# Patient Record
Sex: Male | Born: 1980 | Race: White | Hispanic: No | Marital: Married | Smoking: Never smoker
Health system: Southern US, Community
[De-identification: ages and names within clinical notes are randomized; demographics above are authoritative.]

## PROBLEM LIST (undated history)

## (undated) DIAGNOSIS — Z973 Presence of spectacles and contact lenses: Secondary | ICD-10-CM

## (undated) DIAGNOSIS — N201 Calculus of ureter: Secondary | ICD-10-CM

## (undated) DIAGNOSIS — Z87442 Personal history of urinary calculi: Secondary | ICD-10-CM

## (undated) DIAGNOSIS — I10 Essential (primary) hypertension: Secondary | ICD-10-CM

## (undated) DIAGNOSIS — K219 Gastro-esophageal reflux disease without esophagitis: Secondary | ICD-10-CM

## (undated) DIAGNOSIS — L719 Rosacea, unspecified: Secondary | ICD-10-CM

## (undated) DIAGNOSIS — N200 Calculus of kidney: Secondary | ICD-10-CM

## (undated) HISTORY — DX: Essential (primary) hypertension: I10

## (undated) HISTORY — DX: Rosacea, unspecified: L71.9

---

## 2008-09-06 ENCOUNTER — Encounter (INDEPENDENT_AMBULATORY_CARE_PROVIDER_SITE_OTHER): Payer: Self-pay | Admitting: *Deleted

## 2008-10-05 ENCOUNTER — Encounter: Payer: Self-pay | Admitting: Internal Medicine

## 2008-10-12 ENCOUNTER — Ambulatory Visit: Payer: Self-pay | Admitting: Internal Medicine

## 2008-10-12 DIAGNOSIS — L719 Rosacea, unspecified: Secondary | ICD-10-CM | POA: Insufficient documentation

## 2008-11-02 ENCOUNTER — Telehealth (INDEPENDENT_AMBULATORY_CARE_PROVIDER_SITE_OTHER): Payer: Self-pay | Admitting: *Deleted

## 2009-11-08 ENCOUNTER — Ambulatory Visit: Payer: Self-pay | Admitting: Internal Medicine

## 2009-11-08 DIAGNOSIS — E782 Mixed hyperlipidemia: Secondary | ICD-10-CM | POA: Insufficient documentation

## 2009-11-14 LAB — CONVERTED CEMR LAB
ALT: 18 units/L (ref 0–53)
AST: 17 units/L (ref 0–37)
BUN: 15 mg/dL (ref 6–23)
Basophils Absolute: 0 10*3/uL (ref 0.0–0.1)
Basophils Relative: 0 % (ref 0–1)
Bilirubin, Direct: 0.1 mg/dL (ref 0.0–0.3)
CO2: 25 meq/L (ref 19–32)
Calcium: 9.4 mg/dL (ref 8.4–10.5)
Cholesterol: 199 mg/dL (ref 0–200)
Eosinophils Absolute: 0.2 10*3/uL (ref 0.0–0.7)
Eosinophils Relative: 3 % (ref 0–5)
Glucose, Bld: 86 mg/dL (ref 70–99)
HCT: 47.3 % (ref 39.0–52.0)
Hemoglobin: 16.2 g/dL (ref 13.0–17.0)
Indirect Bilirubin: 0.5 mg/dL (ref 0.0–0.9)
MCHC: 34.2 g/dL (ref 30.0–36.0)
MCV: 86.8 fL (ref 78.0–100.0)
Monocytes Absolute: 0.5 10*3/uL (ref 0.1–1.0)
Monocytes Relative: 7 % (ref 3–12)
Neutro Abs: 4.5 10*3/uL (ref 1.7–7.7)
RBC: 5.45 M/uL (ref 4.22–5.81)
RDW: 13.3 % (ref 11.5–15.5)
Sodium: 141 meq/L (ref 135–145)
TSH: 1.094 microintl units/mL (ref 0.350–4.500)
Total Bilirubin: 0.6 mg/dL (ref 0.3–1.2)
Total CHOL/HDL Ratio: 5.2

## 2010-09-09 NOTE — Assessment & Plan Note (Signed)
Summary: CPX,PLANS TO FAST/RH......   Vital Signs:  Patient profile:   30 year old male Height:      70.25 inches Weight:      182.13 pounds BMI:     26.04 Pulse rate:   58 / minute BP sitting:   130 / 80  Vitals Entered By: Kandice Hams (November 08, 2009 3:01 PM) CC: cpx   History of Present Illness: CPX c/o eye lesions   Allergies: No Known Drug Allergies  Past History:  Past Medical History: Reviewed history from 10/12/2008 and no changes required. rosacea (symptoms increase w/ beer which he avoids , dx @ dermatology)  Past Surgical History: Reviewed history from 10/12/2008 and no changes required. Denies surgical history  Family History: Reviewed history from 10/12/2008 and no changes required. 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 CV:  Denies chest pain or discomfort and swelling of feet. Resp:  Denies cough and shortness of breath. GI:  Denies bloody stools, nausea, and vomiting. GU:  Denies dysuria and hematuria.  Physical Exam  General:  alert, well-developed, and well-nourished.   Eyes:  he has a few lesions in the eyelids consistent with xanthomas Neck:   no masses and no thyroid nodules or tenderness.   Lungs:  normal respiratory effort, no intercostal retractions, no accessory muscle use, and normal breath sounds.   Heart:  normal rate, regular rhythm, and no murmur.   Abdomen:  soft, non-tender, no distention, no masses, no guarding, and no rigidity.   Extremities:  no edema Psych:  Cognition and judgment appear intact. Alert and cooperative with normal attention span and concentration.    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

## 2011-01-06 ENCOUNTER — Ambulatory Visit (INDEPENDENT_AMBULATORY_CARE_PROVIDER_SITE_OTHER): Payer: BC Managed Care – PPO | Admitting: Internal Medicine

## 2011-01-06 ENCOUNTER — Encounter: Payer: Self-pay | Admitting: Internal Medicine

## 2011-01-06 DIAGNOSIS — L089 Local infection of the skin and subcutaneous tissue, unspecified: Secondary | ICD-10-CM | POA: Insufficient documentation

## 2011-01-06 MED ORDER — MUPIROCIN CALCIUM 2 % EX CREA: TOPICAL_CREAM | CUTANEOUS | Status: AC

## 2011-01-06 MED ORDER — DOXYCYCLINE HYCLATE 100 MG PO TABS: 100.0000 mg | ORAL_TABLET | Freq: Two times a day (BID) | ORAL | Status: AC

## 2011-01-06 NOTE — Assessment & Plan Note (Signed)
Likely has a staph infection, not clear to me if he really had a infected "whitehead". Recommend antibiotics and Bactroban, if no better, may need to be seen by dermatology.

## 2011-01-06 NOTE — Progress Notes (Signed)
  Subjective:    Patient ID: Willie Dyer, male    DOB: 04-05-1981, 30 y.o.   MRN: [id number]  HPI 10 days ago noted a  "pimple" at the chain, it never developed a headache, never saw any discharge, is not  going away.  Past Medical History  Diagnosis Date  . Rosacea     symptoms increase w/ beer which he avoicds, dx @ derm   No past surgical history on file.   Review of Systems     Objective:   Physical Exam  HENT:  Head:     Alert, oriented, no apparent distress.       Assessment & Plan:

## 2011-01-06 NOTE — Patient Instructions (Signed)
Call if no better in few days  

## 2011-06-16 ENCOUNTER — Encounter: Payer: Self-pay | Admitting: Internal Medicine

## 2011-06-18 ENCOUNTER — Encounter: Payer: Self-pay | Admitting: Internal Medicine

## 2011-06-18 ENCOUNTER — Ambulatory Visit (INDEPENDENT_AMBULATORY_CARE_PROVIDER_SITE_OTHER): Payer: BC Managed Care – PPO | Admitting: Internal Medicine

## 2011-06-18 DIAGNOSIS — Z0000 Encounter for general adult medical examination without abnormal findings: Secondary | ICD-10-CM | POA: Insufficient documentation

## 2011-06-18 DIAGNOSIS — Z23 Encounter for immunization: Secondary | ICD-10-CM

## 2011-06-18 LAB — COMPREHENSIVE METABOLIC PANEL
ALT: 33 U/L (ref 0–53)
CO2: 29 mEq/L (ref 19–32)
Calcium: 8.9 mg/dL (ref 8.4–10.5)
Chloride: 105 mEq/L (ref 96–112)
Creatinine, Ser: 0.9 mg/dL (ref 0.4–1.5)
GFR: 101.16 mL/min (ref >60.00)
Glucose, Bld: 87 mg/dL (ref 70–99)
Sodium: 142 mEq/L (ref 135–145)
Total Protein: 7.9 g/dL (ref 6.0–8.3)

## 2011-06-18 LAB — CBC WITH DIFFERENTIAL/PLATELET
Basophils Absolute: 0 10*3/uL (ref 0.0–0.1)
Eosinophils Relative: 8.7 % — ABNORMAL HIGH (ref 0.0–5.0)
Lymphocytes Relative: 30.1 % (ref 12.0–46.0)
Monocytes Relative: 8 % (ref 3.0–12.0)
Neutrophils Relative %: 52.6 % (ref 43.0–77.0)
Platelets: 186 10*3/uL (ref 150.0–400.0)
RDW: 13.7 % (ref 11.5–14.6)
WBC: 5.9 10*3/uL (ref 4.5–10.5)

## 2011-06-18 LAB — LIPID PANEL
Cholesterol: 172 mg/dL (ref 0–200)
HDL: 36.1 mg/dL — ABNORMAL LOW (ref >39.00)
Triglycerides: 125 mg/dL (ref 0.0–149.0)
VLDL: 25 mg/dL (ref 0.0–40.0)

## 2011-06-18 NOTE — Assessment & Plan Note (Signed)
Tdap 2010 Had a flu shot already Counseling about diet, exercise, self testicular exam. Labs

## 2011-06-18 NOTE — Progress Notes (Signed)
  Subjective:    Patient ID: Willie Dyer, male    DOB: 06-14-1981, 30 y.o.   MRN: [id number]  HPI Complete physical exam   Past Medical History  Diagnosis Date  . Rosacea     symptoms increase w/ beer which he avoicds, dx @ derm   Past Surgical History  Procedure Date  . No past surgeries    History   Social History  . Marital Status: Married    Spouse Name: N/A    Number of Children: 0  . 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 Smoking status: Never Smoker   . Smokeless tobacco: Never Used  . Alcohol Use: Yes     socially   . Drug Use: No  . Sexually Active: Not on file   Other Topics Concern  . Not on file   Social History Narrative   Diet: room for improvement ---Exercise:2 or 3 times a week---Wife pregnant    Family History  Problem Relation Age of Onset  . Coronary artery disease      GF late in life  . Stroke      GF last in life  . Hypertension Father   . Diabetes Neg Hx   . Colon cancer Neg Hx   . Prostate cancer Neg Hx   . Cancer Neg Hx     Review of Systems Reports he is feeling great, just recovering from a cold. No specific concerns Reports there is room for improvement on his diet. He tries to exercise 2 or 3 times a week, get around 3 hours weekly. Acne is well controlled. Was seen last a few months ago with infection, it cleared quickly. Doing well emotionally.     Objective:   Physical Exam  Constitutional: He is oriented to person, place, and time. He appears well-developed and well-nourished. No distress.  HENT:  Head: Normocephalic and atraumatic.  Neck: No thyromegaly present.       Normal carotid pulse  Cardiovascular: Normal rate, regular rhythm and normal heart sounds.   No murmur heard. Pulmonary/Chest: Effort normal and breath sounds normal. No respiratory distress. He has no wheezes. He has no rales.  Abdominal: Soft. Bowel sounds are normal. He exhibits no  distension. There is no tenderness. There is no rebound and no guarding.  Musculoskeletal: He exhibits no edema.  Neurological: He is alert and oriented to person, place, and time.  Skin: Skin is warm and dry. He is not diaphoretic.  Psychiatric: He has a normal mood and affect. His behavior is normal. Judgment and thought content normal.       Assessment & Plan:

## 2011-06-18 NOTE — Patient Instructions (Signed)
Call as needed, follow up 1 year

## 2012-06-21 ENCOUNTER — Ambulatory Visit (INDEPENDENT_AMBULATORY_CARE_PROVIDER_SITE_OTHER): Payer: BC Managed Care – PPO | Admitting: Internal Medicine

## 2012-06-21 ENCOUNTER — Encounter: Payer: Self-pay | Admitting: Internal Medicine

## 2012-06-21 VITALS — BP 138/82 | HR 70 | Temp 98.0°F | Ht 71.25 in | Wt 180.0 lb

## 2012-06-21 DIAGNOSIS — Z0000 Encounter for general adult medical examination without abnormal findings: Secondary | ICD-10-CM

## 2012-06-21 DIAGNOSIS — M542 Cervicalgia: Secondary | ICD-10-CM

## 2012-06-21 LAB — LIPID PANEL
Cholesterol: 198 mg/dL (ref 0–200)
HDL: 39 mg/dL — ABNORMAL LOW (ref >39.00)
Total CHOL/HDL Ratio: 5
Triglycerides: 108 mg/dL (ref 0.0–149.0)

## 2012-06-21 MED ORDER — CYCLOBENZAPRINE HCL 10 MG PO TABS: 10.0000 mg | ORAL_TABLET | Freq: Every evening | ORAL | Status: DC | PRN

## 2012-06-21 NOTE — Patient Instructions (Signed)
Heat pads as neede Ibuprofen (Motrin) 200 mg OTC 2 or 3 tabs every 8 hours as need for pain . This medicine can cause gastritis-ulcers in the stomach, if you develop nausea, stomach pain, change in the color of stools : stop ibuprofen and call us Flexeril (a muscle relaxant) at night, will make you sleepy If you are not well in 2-3 weeks or if the problem become a recurrent issue, let me know

## 2012-06-21 NOTE — Assessment & Plan Note (Signed)
See history of present illness, on-off neck pain, the last episode had some radicular features. Plan: Conservative treatment, see instructions.

## 2012-06-21 NOTE — Assessment & Plan Note (Signed)
Tdap 2010 Had a flu shot already doing great w/ exercise, discussed diet  self testicular exam encouraged Labs last year wnl, check a FLP

## 2012-06-21 NOTE — Progress Notes (Signed)
  Subjective:    Patient ID: Willie Dyer, male    DOB: Sep 18, 1980, 31 y.o.   MRN: [id number]  HPI CPX  Past Medical History  Diagnosis Date  . Rosacea     symptoms increase w/ beer which he avoicds, dx @ derm   Past Surgical History  Procedure Date  . No past surgeries    History   Social History  . Marital Status: Married    Spouse Name: N/A    Number of Children: 1  . 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 Smoking status: Never Smoker   . Smokeless tobacco: Never Used  . Alcohol Use: Yes     Comment: socially   . Drug Use: No  . Sexually Active: Yes   Other Topics Concern  . Not on file   Social History Narrative   Diet: room for improvement ---Exercise : Goes to the gym 3 or 4 times a week, runs 4 miles.----Has a 17-month-old baby at home   Family History  Problem Relation Age of Onset  . Coronary artery disease      GF late in life  . Stroke      GF last in life  . Hypertension Father   . Diabetes Neg Hx   . Colon cancer Neg Hx   . Prostate cancer Neg Hx   . Cancer Neg Hx     Review of Systems Had 4 episodes of posterior neck pain in the summer, episodes lasted 4-5 days, neck felt stiff and had decreased range of motion. 3 weeks ago developed a different neck pain, the pain was left-sided and with some radiation to the left trapezoid area. Overall the pain is already getting better. No chest pain or shortness of breath No nausea, vomiting, diarrhea or blood in the stools. No dysuria or gross hematuria.     Objective:   Physical Exam General -- alert, well-developed, and well-nourished.   Neck - full range of motion, nontender to palpation of the cervical spine Lungs -- normal respiratory effort, no intercostal retractions, no accessory muscle use, and normal breath sounds.   Heart-- normal rate, regular rhythm, no murmur, and no gallop.   Abdomen--soft, non-tender, no distention, no  masses, no HSM, no guarding, and no rigidity.   Extremities-- no pretibial edema bilaterally Neurologic-- alert & oriented X3 , speech gait and motor are intact. DTRs symmetric. Psych-- Cognition and judgment appear intact. Alert and cooperative with normal attention span and concentration.  not anxious appearing and not depressed appearing.      Assessment & Plan:

## 2012-06-22 ENCOUNTER — Encounter: Payer: Self-pay | Admitting: Internal Medicine

## 2012-06-23 ENCOUNTER — Encounter: Payer: Self-pay | Admitting: *Deleted

## 2012-06-24 ENCOUNTER — Encounter: Payer: Self-pay | Admitting: Internal Medicine

## 2012-09-24 ENCOUNTER — Other Ambulatory Visit: Payer: Self-pay

## 2012-11-28 ENCOUNTER — Ambulatory Visit: Payer: BC Managed Care – PPO | Admitting: Family Medicine

## 2012-11-28 ENCOUNTER — Ambulatory Visit (INDEPENDENT_AMBULATORY_CARE_PROVIDER_SITE_OTHER): Payer: BC Managed Care – PPO | Admitting: Internal Medicine

## 2012-11-28 ENCOUNTER — Other Ambulatory Visit (INDEPENDENT_AMBULATORY_CARE_PROVIDER_SITE_OTHER): Payer: BC Managed Care – PPO

## 2012-11-28 ENCOUNTER — Encounter: Payer: Self-pay | Admitting: Internal Medicine

## 2012-11-28 ENCOUNTER — Encounter: Payer: Self-pay | Admitting: *Deleted

## 2012-11-28 VITALS — BP 138/86 | HR 67 | Temp 98.3°F | Ht 72.0 in | Wt 182.5 lb

## 2012-11-28 DIAGNOSIS — N3090 Cystitis, unspecified without hematuria: Secondary | ICD-10-CM

## 2012-11-28 DIAGNOSIS — R350 Frequency of micturition: Secondary | ICD-10-CM

## 2012-11-28 LAB — CBC
HCT: 48.1 % (ref 39.0–52.0)
Hemoglobin: 16.6 g/dL (ref 13.0–17.0)
MCHC: 34.6 g/dL (ref 30.0–36.0)
MCV: 85.6 fl (ref 78.0–100.0)
Platelets: 199 10*3/uL (ref 150.0–400.0)

## 2012-11-28 LAB — POCT URINALYSIS DIPSTICK
Bilirubin, UA: NEGATIVE
Blood, UA: POSITIVE
Nitrite, UA: NEGATIVE
Spec Grav, UA: 1.01
pH, UA: 6

## 2012-11-28 NOTE — Progress Notes (Signed)
  Subjective:    Patient ID: Willie Dyer, male    DOB: 09/27/1980, 32 y.o.   MRN: [id number]  HPI  Pt presents to the clinic today with c/o urinary frequency x 2 days. He does drink a lot of water. He denies fever, chills, body aches, back pain or blood in his urine. He does not have any burning sensation or pain with urination. He does get up at night to urinate. He has no concerns about STI's.  Review of Systems  Past Medical History  Diagnosis Date  . Rosacea     symptoms increase w/ beer which he avoicds, dx @ derm    No current outpatient prescriptions on file.   No current facility-administered medications for this visit.    No Known Allergies  Family History  Problem Relation Age of Onset  . Coronary artery disease      GF late in life  . Stroke      GF last in life  . Hypertension Father   . Diabetes Neg Hx   . Colon cancer Neg Hx   . Prostate cancer Neg Hx   . Cancer Neg Hx     History   Social History  . Marital Status: Married    Spouse Name: N/A    Number of Children: 1  . 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 Smoking status: Never Smoker   . Smokeless tobacco: Never Used  . Alcohol Use: Yes     Comment: socially   . Drug Use: No  . Sexually Active: Yes   Other Topics Concern  . Not on file   Social History Narrative   Diet: room for improvement ---   Exercise : Goes to the gym 3 or 4 times a week, runs 4 miles.----   Has a 5-month-old baby at home     Constitutional: Denies fever, malaise, fatigue, headache or abrupt weight changes.  GU: Pt reports frequency. Denies urgency, pain with urination, burning sensation, blood in urine, odor or discharge.   No other specific complaints in a complete review of systems (except as listed in HPI above).     Objective:   Physical Exam   BP 138/86  Pulse 67  Temp(Src) 98.3 F (36.8 C) (Oral)  Ht 6' (1.829 m)  Wt 182 lb 8 oz  (82.781 kg)  BMI 24.75 kg/m2  SpO2 99% Wt Readings from Last 3 Encounters:  11/28/12 182 lb 8 oz (82.781 kg)  06/21/12 180 lb (81.647 kg)  06/18/11 185 lb (83.915 kg)    General: Appears his stated age, well developed, well nourished in NAD. Cardiovascular: Normal rate and rhythm. S1,S2 noted.  No murmur, rubs or gallops noted. No JVD or BLE edema. No carotid bruits noted. Pulmonary/Chest: Normal effort and positive vesicular breath sounds. No respiratory distress. No wheezes, rales or ronchi noted.  Abdomen: Soft and nontender. Normal bowel sounds, no bruits noted. No distention or masses noted. Liver, spleen and kidneys non palpable. No CVA tenderness. Pt declined rectal exam.       Assessment & Plan:  Urinary Frequency, most likely cystitis, new onset:  Will perform Urinalysis today- + blood Pt declined DRE Will obtain CBC, PSA to check for acute prostatitis  Will f/u after labs results come back

## 2012-11-28 NOTE — Patient Instructions (Signed)

## 2013-01-18 ENCOUNTER — Other Ambulatory Visit: Payer: Self-pay | Admitting: Urology

## 2013-01-20 ENCOUNTER — Encounter (HOSPITAL_COMMUNITY): Payer: Self-pay | Admitting: Pharmacy Technician

## 2013-01-24 ENCOUNTER — Encounter (HOSPITAL_COMMUNITY): Payer: Self-pay | Admitting: *Deleted

## 2013-01-29 MED ORDER — GENTAMICIN SULFATE 40 MG/ML IJ SOLN
400.0000 mg | INTRAVENOUS | Status: AC
  Administered 2013-01-30: 400 mg via INTRAVENOUS
  Filled 2013-01-29 (×2): qty 10

## 2013-01-30 ENCOUNTER — Ambulatory Visit (HOSPITAL_COMMUNITY): Payer: BC Managed Care – PPO

## 2013-01-30 ENCOUNTER — Ambulatory Visit (HOSPITAL_COMMUNITY)
Admission: RE | Admit: 2013-01-30 | Discharge: 2013-01-30 | Disposition: A | Discharge status: Home / Self Care | Payer: BC Managed Care – PPO | Source: Ambulatory Visit | Attending: Urology | Admitting: Urology

## 2013-01-30 ENCOUNTER — Encounter (HOSPITAL_COMMUNITY)
Admission: RE | Disposition: A | Discharge status: Home / Self Care | Payer: Self-pay | Source: Ambulatory Visit | Attending: Urology

## 2013-01-30 DIAGNOSIS — N201 Calculus of ureter: Secondary | ICD-10-CM | POA: Insufficient documentation

## 2013-01-30 HISTORY — PX: LITHOTRIPSY: SUR834

## 2013-01-30 SURGERY — LITHOTRIPSY, ESWL
Anesthesia: LOCAL | Laterality: Left

## 2013-01-30 MED ORDER — DIPHENHYDRAMINE HCL 25 MG PO CAPS
25.0000 mg | ORAL_CAPSULE | ORAL | Status: AC
Start: 2013-01-30 — End: 2013-01-30
  Administered 2013-01-30: 25 mg via ORAL
  Filled 2013-01-30: qty 1

## 2013-01-30 MED ORDER — SODIUM CHLORIDE 0.9 % IV SOLN
INTRAVENOUS | Status: DC
  Administered 2013-01-30: 15:00:00 via INTRAVENOUS

## 2013-01-30 MED ORDER — DIAZEPAM 5 MG PO TABS
10.0000 mg | ORAL_TABLET | ORAL | Status: AC
  Administered 2013-01-30: 10 mg via ORAL
  Filled 2013-01-30: qty 2

## 2013-01-30 NOTE — H&P (Signed)
Willie Dyer is an 32 y.o. male.    Chief Complaint: Pre-Op Left Shockwave Lithotripsy  HPI:      1 - Left Distal Ureteral Stone - Pt with 5mm left distal ureteral stone, 500HU, SSD 11cm astutuely found by Dr. Sherron Monday for persistant lower urinary tract symptoms and suprapubic pain / microhematuria. Minimal flank pain. No interval passage. NO additional stones by imaging. No fevers. Is visible on KUB.  Most recent UCX negative.  Today Willie Dyer is seen to proceed with left shockwave lithotripsy for his distal stone.     Past Medical History  Diagnosis Date  . Rosacea     symptoms increase w/ beer which he avoicds, dx @ derm  . Chronic kidney disease     left ureteral stone    Past Surgical History  Procedure Laterality Date  . No past surgeries      Family History  Problem Relation Age of Onset  . Coronary artery disease      GF late in life  . Stroke      GF last in life  . Hypertension Father   . Diabetes Neg Hx   . Colon cancer Neg Hx   . Prostate cancer Neg Hx   . Cancer Neg Hx    Social History:  reports that he has never smoked. He has never used smokeless tobacco. He reports that he drinks about 0.6 ounces of alcohol per week. He reports that he does not use illicit drugs.  Allergies: No Known Allergies  No prescriptions prior to admission    No results found for this or any previous visit (from the past 48 hour(s)). No results found.  Review of Systems  Constitutional: Negative.  Negative for fever and chills.  HENT: Negative.   Eyes: Negative.   Respiratory: Negative.   Cardiovascular: Negative.   Gastrointestinal: Negative.   Genitourinary: Negative.   Musculoskeletal: Negative.   Skin: Negative.   Neurological: Negative.   Endo/Heme/Allergies: Negative.   Psychiatric/Behavioral: Negative.     There were no vitals taken for this visit. Physical Exam  Constitutional: He is oriented to person, place, and time. He appears well-developed and  well-nourished.  HENT:  Head: Normocephalic and atraumatic.  Eyes: EOM are normal. Pupils are equal, round, and reactive to light.  Neck: Normal range of motion. Neck supple.  Cardiovascular: Normal rate and regular rhythm.   Respiratory: Effort normal and breath sounds normal.  GI: Soft. Bowel sounds are normal.  Genitourinary: Penis normal.  Minimal Left CVAT  Musculoskeletal: Normal range of motion.  Neurological: He is alert and oriented to person, place, and time.  Skin: Skin is warm and dry.  Psychiatric: He has a normal mood and affect. His behavior is normal. Judgment and thought content normal.     Assessment/Plan  1 - Left Distal Ureteral Stone -  We rediscussed shockwave lithotripsy in detail as well as my "rule of 9s" with stones <26mm, less than 900 HU, and skin to stone distance <9cm having approximately 90% treatment success with single session of treatment. We then readdressed how stones that are larger, more dense, and in patients with less favorable anatomy have incrementally decreased success rates. We rediscussed risks including, bleeding, infection, hematoma, loss of kidney, need for staged therapy, need for adjunctive therapy and requirement to refrain from any anticoagulants, anti-platelet or aspirin-like products peri-procedureally. After careful consideration, the patient has chosen to proceed.    Willie Dyer 01/30/2013, 8:27 AM

## 2013-02-21 ENCOUNTER — Other Ambulatory Visit: Payer: Self-pay | Admitting: Urology

## 2013-02-22 ENCOUNTER — Encounter (HOSPITAL_COMMUNITY): Payer: Self-pay | Admitting: Pharmacy Technician

## 2013-02-23 ENCOUNTER — Encounter (HOSPITAL_COMMUNITY)
Admission: RE | Admit: 2013-02-23 | Discharge: 2013-02-23 | Disposition: A | Discharge status: Home / Self Care | Payer: BC Managed Care – PPO | Source: Ambulatory Visit | Attending: Urology | Admitting: Urology

## 2013-02-23 ENCOUNTER — Encounter (HOSPITAL_COMMUNITY): Payer: Self-pay

## 2013-02-23 ENCOUNTER — Other Ambulatory Visit (HOSPITAL_COMMUNITY): Payer: Self-pay | Admitting: Urology

## 2013-02-23 LAB — CBC
HCT: 46.7 % (ref 39.0–52.0)
Hemoglobin: 16.4 g/dL (ref 13.0–17.0)
MCH: 29.7 pg (ref 26.0–34.0)
MCHC: 35.1 g/dL (ref 30.0–36.0)
RBC: 5.52 MIL/uL (ref 4.22–5.81)

## 2013-02-23 NOTE — Patient Instructions (Addendum)
[address] Jemarcus Dougal Boller  02/23/2013   Your procedure is scheduled on: Monday 02-27-2013  Report to [facility] a 530 AM.  Call this number if you have problems the morning of surgery [phone]   Remember:   Do not eat food or drink liquids :After Midnight.     Take these medicines the morning of surgery with A SIP OF WATER: none                                SEE [facility] PREPARING FOR SURGERY SHEET   Do not wear jewelry, make-up or nail polish.  Do not wear lotions, powders, or perfumes. You may wear deodorant.   Men may shave face and neck.  Do not bring valuables to the hospital.  IS NOT RESPONSIBLE FOR VALUEABLES.  Contacts, dentures or bridgework may not be worn into surgery.  Leave suitcase in the car. After surgery it may be brought to your room.  For patients admitted to the hospital, checkout time is 11:00 AM the day of discharge.   Patients discharged the day of surgery will not be allowed to drive home.  Name and phone number of your driver:father Tal Mashek sr. Cell [phone]  Special Instructions: N/A   Please read over the following fact sheets that you were given: MRSA Information.  Call Cain Sieve RN pre op nurse if needed [phone]    FAILURE TO FOLLOW THESE INSTRUCTIONS MAY RESULT IN THE CANCELLATION OF YOUR SURGERY.  PATIENT SIGNATURE___________________________________________  NURSE SIGNATURE_____________________________________________

## 2013-02-23 NOTE — Progress Notes (Signed)
Pt bp elevated at pre op visit 185/107, 166/106, 160/100, did ekg results normal sinus rhythm

## 2013-02-23 NOTE — Progress Notes (Signed)
Faxed dr Berneice Heinrich by epic and made aware bp elevated at pre op visit

## 2013-02-27 ENCOUNTER — Ambulatory Visit (HOSPITAL_COMMUNITY)
Admission: RE | Admit: 2013-02-27 | Discharge: 2013-02-27 | Disposition: A | Discharge status: Home / Self Care | Payer: BC Managed Care – PPO | Source: Ambulatory Visit | Attending: Urology | Admitting: Urology

## 2013-02-27 ENCOUNTER — Ambulatory Visit (HOSPITAL_COMMUNITY): Payer: BC Managed Care – PPO | Admitting: Certified Registered Nurse Anesthetist

## 2013-02-27 ENCOUNTER — Encounter (HOSPITAL_COMMUNITY): Payer: Self-pay | Admitting: Certified Registered Nurse Anesthetist

## 2013-02-27 ENCOUNTER — Encounter (HOSPITAL_COMMUNITY): Payer: Self-pay | Admitting: *Deleted

## 2013-02-27 ENCOUNTER — Encounter (HOSPITAL_COMMUNITY)
Admission: RE | Disposition: A | Discharge status: Home / Self Care | Payer: Self-pay | Source: Ambulatory Visit | Attending: Urology

## 2013-02-27 DIAGNOSIS — L719 Rosacea, unspecified: Secondary | ICD-10-CM | POA: Insufficient documentation

## 2013-02-27 DIAGNOSIS — N201 Calculus of ureter: Secondary | ICD-10-CM | POA: Insufficient documentation

## 2013-02-27 HISTORY — PX: CYSTOSCOPY WITH RETROGRADE PYELOGRAM, URETEROSCOPY AND STENT PLACEMENT: SHX5789

## 2013-02-27 HISTORY — PX: HOLMIUM LASER APPLICATION: SHX5852

## 2013-02-27 SURGERY — CYSTOURETEROSCOPY, WITH RETROGRADE PYELOGRAM AND STENT INSERTION
Anesthesia: General | Laterality: Left | Wound class: Clean Contaminated

## 2013-02-27 MED ORDER — LACTATED RINGERS IV SOLN
INTRAVENOUS | Status: DC | PRN
  Administered 2013-02-27: 07:00:00 via INTRAVENOUS

## 2013-02-27 MED ORDER — MIDAZOLAM HCL 5 MG/5ML IJ SOLN
INTRAMUSCULAR | Status: DC | PRN
  Administered 2013-02-27: 2 mg via INTRAVENOUS

## 2013-02-27 MED ORDER — PROMETHAZINE HCL 25 MG/ML IJ SOLN: 6.2500 mg | INTRAMUSCULAR | Status: DC | PRN

## 2013-02-27 MED ORDER — LACTATED RINGERS IV SOLN: INTRAVENOUS | Status: DC

## 2013-02-27 MED ORDER — SODIUM CHLORIDE 0.9 % IR SOLN
Status: DC | PRN
  Administered 2013-02-27: 4000 mL

## 2013-02-27 MED ORDER — HYDROCODONE-ACETAMINOPHEN 10-325 MG PO TABS: 1.0000 | ORAL_TABLET | Freq: Four times a day (QID) | ORAL | Status: DC | PRN

## 2013-02-27 MED ORDER — SULFAMETHOXAZOLE-TMP DS 800-160 MG PO TABS: 1.0000 | ORAL_TABLET | Freq: Every day | ORAL | Status: DC

## 2013-02-27 MED ORDER — FENTANYL CITRATE 0.05 MG/ML IJ SOLN
INTRAMUSCULAR | Status: DC | PRN
  Administered 2013-02-27 (×2): 50 ug via INTRAVENOUS

## 2013-02-27 MED ORDER — IOHEXOL 300 MG/ML  SOLN
INTRAMUSCULAR | Status: DC | PRN
  Administered 2013-02-27: 10 mL

## 2013-02-27 MED ORDER — KETOROLAC TROMETHAMINE 30 MG/ML IJ SOLN
INTRAMUSCULAR | Status: DC | PRN
  Administered 2013-02-27: 30 mg via INTRAVENOUS

## 2013-02-27 MED ORDER — IOHEXOL 300 MG/ML  SOLN
INTRAMUSCULAR | Status: AC
  Filled 2013-02-27: qty 1

## 2013-02-27 MED ORDER — LIDOCAINE HCL (CARDIAC) 20 MG/ML IV SOLN
INTRAVENOUS | Status: DC | PRN
  Administered 2013-02-27: 50 mg via INTRAVENOUS

## 2013-02-27 MED ORDER — HYDROMORPHONE HCL PF 1 MG/ML IJ SOLN: 0.2500 mg | INTRAMUSCULAR | Status: DC | PRN

## 2013-02-27 MED ORDER — ONDANSETRON HCL 4 MG/2ML IJ SOLN
INTRAMUSCULAR | Status: DC | PRN
  Administered 2013-02-27: 4 mg via INTRAVENOUS

## 2013-02-27 MED ORDER — PROPOFOL 10 MG/ML IV BOLUS
INTRAVENOUS | Status: DC | PRN
  Administered 2013-02-27: 200 mg via INTRAVENOUS

## 2013-02-27 MED ORDER — DEXTROSE 5 % IV SOLN
5.0000 mg/kg | INTRAVENOUS | Status: AC
  Administered 2013-02-27: 410 mg via INTRAVENOUS
  Filled 2013-02-27: qty 10.25

## 2013-02-27 MED ORDER — SENNOSIDES-DOCUSATE SODIUM 8.6-50 MG PO TABS: 1.0000 | ORAL_TABLET | Freq: Two times a day (BID) | ORAL | Status: DC

## 2013-02-27 SURGICAL SUPPLY — 19 items
BAG URINE DRAINAGE (UROLOGICAL SUPPLIES) IMPLANT
BASKET LASER NITINOL 1.9FR (BASKET) ×2 IMPLANT
BASKET STNLS GEMINI 4WIRE 3FR (BASKET) IMPLANT
BASKET ZERO TIP NITINOL 2.4FR (BASKET) IMPLANT
CATH INTERMIT  6FR 70CM (CATHETERS) ×2 IMPLANT
DRAPE CAMERA CLOSED 9X96 (DRAPES) ×2 IMPLANT
ELECT REM PT RETURN 9FT ADLT (ELECTROSURGICAL)
ELECTRODE REM PT RTRN 9FT ADLT (ELECTROSURGICAL) IMPLANT
GLOVE BIOGEL M STRL SZ7.5 (GLOVE) ×6 IMPLANT
GOWN PREVENTION PLUS LG XLONG (DISPOSABLE) ×2 IMPLANT
GUIDEWIRE ANG ZIPWIRE 038X150 (WIRE) ×2 IMPLANT
GUIDEWIRE STR DUAL SENSOR (WIRE) ×2 IMPLANT
IV NS IRRIG 3000ML ARTHROMATIC (IV SOLUTION) IMPLANT
LASER FIBER DISP (UROLOGICAL SUPPLIES) ×2 IMPLANT
PACK CYSTO (CUSTOM PROCEDURE TRAY) ×2 IMPLANT
STENT CONTOUR 6FRX26X.038 (STENTS) ×2 IMPLANT
SYRINGE 10CC LL (SYRINGE) IMPLANT
SYRINGE IRR TOOMEY STRL 70CC (SYRINGE) IMPLANT
TUBE FEEDING 8FR 16IN STR KANG (MISCELLANEOUS) ×2 IMPLANT

## 2013-02-27 NOTE — Anesthesia Postprocedure Evaluation (Signed)
Anesthesia Post Note  Patient: Willie Dyer  Procedure(s) Performed: Procedure(s) (LRB): CYSTOSCOPY WITH RETROGRADE PYELOGRAM, URETEROSCOPY AND STENT PLACEMENT (Left) HOLMIUM LASER APPLICATION (Left)  Anesthesia type: General  Patient location: PACU  Post pain: Pain level controlled  Post assessment: Post-op Vital signs reviewed  Last Vitals:  Filed Vitals:   02/27/13 0835  BP:   Pulse: 83  Temp:   Resp: 20    Post vital signs: Reviewed  Level of consciousness: sedated  Complications: No apparent anesthesia complications

## 2013-02-27 NOTE — Brief Op Note (Signed)
02/27/2013  8:10 AM  PATIENT:  Willie Dyer  32 y.o. male  PRE-OPERATIVE DIAGNOSIS:  LEFT URETERAL STONE  POST-OPERATIVE DIAGNOSIS:  left ureteral stone  PROCEDURE:  Procedure(s) with comments: CYSTOSCOPY WITH RETROGRADE PYELOGRAM, URETEROSCOPY AND STENT PLACEMENT (Left) - 1 HR NEED DIGITAL URETERSCOPE  HOLMIUM LASER APPLICATION (Left)  SURGEON:  Surgeon(s) and Role:    * Sebastian Ache, MD - Primary  PHYSICIAN ASSISTANT:   ASSISTANTS: none   ANESTHESIA:   general  EBL:     BLOOD ADMINISTERED:none  DRAINS: none   LOCAL MEDICATIONS USED:  NONE  SPECIMEN:  Source of Specimen:  Left Distal Ureteral Stone (fragments)  DISPOSITION OF SPECIMEN:  [facility] for Compositional analysis  COUNTS:  YES  TOURNIQUET:  * No tourniquets in log *  DICTATION: .Other Dictation: Dictation Number [id number]  PLAN OF CARE: Discharge to home after PACU  PATIENT DISPOSITION:  PACU - hemodynamically stable.   Delay start of Pharmacological VTE agent (>24hrs) due to surgical blood loss or risk of bleeding: not applicable

## 2013-02-27 NOTE — Anesthesia Preprocedure Evaluation (Signed)
Anesthesia Evaluation  Patient identified by MRN, date of birth, ID band Patient awake    Reviewed: Allergy & Precautions, H&P , NPO status , Patient's Chart, lab work & pertinent test results  Airway Mallampati: II TM Distance: >3 FB Neck ROM: Full    Dental  (+) Teeth Intact and Dental Advisory Given   Pulmonary neg pulmonary ROS,  breath sounds clear to auscultation  Pulmonary exam normal       Cardiovascular negative cardio ROS  Rhythm:Regular Rate:Normal     Neuro/Psych negative neurological ROS  negative psych ROS   GI/Hepatic negative GI ROS, Neg liver ROS,   Endo/Other  negative endocrine ROS  Renal/GU negative Renal ROS  negative genitourinary   Musculoskeletal negative musculoskeletal ROS (+)   Abdominal   Peds  Hematology negative hematology ROS (+)   Anesthesia Other Findings   Reproductive/Obstetrics                           Anesthesia Physical Anesthesia Plan  ASA: I  Anesthesia Plan: General   Post-op Pain Management:    Induction: Intravenous  Airway Management Planned: LMA  Additional Equipment:   Intra-op Plan:   Post-operative Plan: Extubation in OR  Informed Consent: I have reviewed the patients History and Physical, chart, labs and discussed the procedure including the risks, benefits and alternatives for the proposed anesthesia with the patient or authorized representative who has indicated his/her understanding and acceptance.   Dental advisory given  Plan Discussed with: CRNA  Anesthesia Plan Comments:         Anesthesia Quick Evaluation

## 2013-02-27 NOTE — H&P (Signed)
Willie Dyer is an 32 y.o. male.    Chief Complaint: Pre-Op Left Ureteroscopic Stone Manipulation  HPI:   1 - Left Distal Ureteral Stone - s/p left shockwave lithotripsy 01/30/13 for 5mm left distal ureteral stone, 500HU, SSD 11cm astutuely found by Dr. Sherron Monday for persistant lower urinary tract symptoms and suprapubic pain / microhematuria. Minimal flank pain. No interval passage. NO additional stones by imaging. No fevers.   Today Willie Dyer is seen to proceed with left ureteroscopic stone manipulation for his refractory left distal ureteral stone. Most recent UA without infectious parameters.  Past Medical History  Diagnosis Date  . Rosacea     symptoms increase w/ beer which he avoicds, dx @ derm  . Ureteral stone     left    Past Surgical History  Procedure Laterality Date  . Lithotripsy  01-30-2013    Family History  Problem Relation Age of Onset  . Coronary artery disease      GF late in life  . Stroke      GF last in life  . Hypertension Father   . Diabetes Neg Hx   . Colon cancer Neg Hx   . Prostate cancer Neg Hx   . Cancer Neg Hx    Social History:  reports that he has never smoked. He has never used smokeless tobacco. He reports that he drinks about 0.6 ounces of alcohol per week. He reports that he does not use illicit drugs.  Allergies: No Known Allergies  Medications Prior to Admission  Medication Sig Dispense Refill  . fish oil-omega-3 fatty acids 1000 MG capsule Take 1 g by mouth daily.      Marland Kitchen HYDROcodone-acetaminophen (NORCO) 10-325 MG per tablet Take 1-2 tablets by mouth every 6 (six) hours as needed for pain.      . meloxicam (MOBIC) 15 MG tablet Take 15 mg by mouth daily.        No results found for this or any previous visit (from the past 48 hour(s)). No results found.  Review of Systems  Constitutional: Negative.  Negative for fever and chills.  HENT: Negative.   Eyes: Negative.   Respiratory: Negative.   Cardiovascular: Negative.    Gastrointestinal: Negative.   Genitourinary: Positive for frequency and hematuria. Negative for flank pain.  Musculoskeletal: Negative.   Skin: Negative.   Neurological: Negative.   Endo/Heme/Allergies: Negative.   Psychiatric/Behavioral: Negative.     There were no vitals taken for this visit. Physical Exam  Constitutional: He is oriented to person, place, and time. He appears well-developed and well-nourished.  HENT:  Head: Normocephalic and atraumatic.  Eyes: EOM are normal. Pupils are equal, round, and reactive to light.  Neck: Normal range of motion. Neck supple.  Cardiovascular: Normal rate and regular rhythm.   Respiratory: Effort normal and breath sounds normal.  GI: Soft. Bowel sounds are normal.  Genitourinary: Penis normal.  No CVAT  Musculoskeletal: Normal range of motion.  Neurological: He is alert and oriented to person, place, and time.  Skin: Skin is warm and dry.  Psychiatric: He has a normal mood and affect. His behavior is normal. Judgment and thought content normal.     Assessment/Plan  1 - Left Distal Ureteral Stone -Unfortunately, despite being excellent candidate for lithotripsy, his stone appears to persist by KUB and clinically. We rediscussed more time for passage v. proceed wtih URS. He wants to be stone free before start of fall semester in [place] and has opted for URS today as  scheduled, this is reasonable.   We rediscussed ureteroscopic stone manipulation with basketing and laser-lithotripsy in detail.  We rediscussed risks including bleeding, infection, damage to kidney / ureter  bladder, rarely loss of kidney. We rediscussed anesthetic risks and rare but serious surgical complications including DVT, PE, MI, and mortality. We specifically readdressed that in 5-10% of cases a staged approach is required with stenting followed by re-attempt ureteroscopy if anatomy unfavorable. The patient voiced understanding and wises to proceed.   Jacier Gladu,  Emelee Rodocker 02/27/2013, 5:54 AM

## 2013-02-27 NOTE — Transfer of Care (Signed)
Immediate Anesthesia Transfer of Care Note  Patient: Nicklas Mcsweeney Appelbaum  Procedure(s) Performed: Procedure(s) with comments: CYSTOSCOPY WITH RETROGRADE PYELOGRAM, URETEROSCOPY AND STENT PLACEMENT (Left) - 1 HR NEED DIGITAL URETERSCOPE  HOLMIUM LASER APPLICATION (Left)  Patient Location: PACU  Anesthesia Type:General  Level of Consciousness: sedated  Airway & Oxygen Therapy: Patient Spontanous Breathing and Patient connected to face mask oxygen  Post-op Assessment: Report given to PACU RN and Post -op Vital signs reviewed and stable  Post vital signs: Reviewed and stable  Complications: No apparent anesthesia complications

## 2013-02-28 ENCOUNTER — Encounter (HOSPITAL_COMMUNITY): Payer: Self-pay | Admitting: Urology

## 2013-02-28 NOTE — Op Note (Signed)
NAMEMarland Kitchen  ZACHREY, DEUTSCHER NO.:  [id number]  MEDICAL RECORD NO.:  [id number]  LOCATION:  [facility]                         FACILITY:  [facility]  PHYSICIAN:  Sebastian Ache, MD     DATE OF BIRTH:  1981/06/16  DATE OF PROCEDURE: 02/27/2013 DATE OF DISCHARGE:  02/27/2013                              OPERATIVE REPORT   DIAGNOSIS:  Refractory left distal ureteral stone.  PROCEDURE: 1. Cystoscopy with left retrograde pyelogram interpretation. 2. Left ureteroscopy with laser lithotripsy. 3. Insertion of left ureteral stent 6 x 26 with tether.  ESTIMATED BLOOD LOSS:  Nil.  COMPLICATIONS:  None.  SPECIMEN:  Left distal ureteral stone fragments for compositional analysis.  FINDINGS: 1. Unremarkable urinary bladder. 2. Impacted left distal ureteral stone with moderate periureteral     edema. 3. Almost no evidence of prior fragmentation. 4. Otherwise unremarkable left ureter and kidney.  INDICATION:  Mr. Vespa is a pleasant 32 year old gentleman with recent history of left distal ureteral stone.  He underwent initial trial of medical expulsive therapy; however, the stone was refractory. He underwent shockwave lithotripsy as he was felt to be an outstanding candidate for this based on his size stone density and skin to stone distance.  He underwent lithotripsy on June 23, however, his stone persisted radiographically and he failed to pass any significant fragments.  This was corroborated by subsequent KUB.  Options were discussed including continued medical therapy versus repeat several lithotripsy versus definitive ureteroscopy and wished to proceed with the latter.  Informed consent was obtained and placed in medical record.  PROCEDURE IN DETAIL:  The patient being Conrado Nance verified, procedure being left ureteroscopic stone manipulation was confirmed. Procedure was carried out.  Time-out was performed.  IV antibiotics administered.  General LMA anesthesia was  introduced patient placed into a low lithotomy position.  Sterile field was created by prepping the patient's penis, perineum, and proximal thighs using iodine x3.  Next, cystourethroscopy was performed using a 22-French cystoscope with 12- degree offset lens.  Inspection of the urinary bladder and the anterior and posterior urethra were unremarkable.  The left ureteral orifice was cannulated with a 6-French end-hole catheter and left retrograde pyelogram was obtained.  Left retrograde pyelogram demonstrated a single left ureter, single system, left kidney.  There was a filling defect in distal ureter consistent with known stone.  A 0.038 Glidewire was advanced at the level of the upper pole and set aside as a safety wire.  Next, semi- rigid ureteroscopy was performed of the distal left ureter.  A 6.4- French semi-rigid ureteroscope alongside a separate Sensor working wire and an 8-French feeding tube in the urinary bladder for pressure release.  This revealed a significantly impacted left distal ureteral stone.  This appeared to be much too large for simple basketing.  There was no obvious evidence of successful previous fragmentation and holmium laser energy was applied to the stone using settings of 0.5 joules and 5 hertz with a 200 nm fiber, fragmenting the stone into approximately 3 smaller pieces.  Each of these were grasped with escape basket and brought out in its entirety and set aside for compositional analysis. Semi-rigid ureteroscopy was performed at the entire  length of left ureter.  No mucosal abnormalities or additional calcifications were noted.  The semi-rigid ureteroscope was then exchanged for the flexible 6-French scope which allowed visualization of the entire left kidney. This revealed excellent hemostasis.  No evidence of perforation.  No evidence of residual stone fragments.  Given the significant distal left ureteral edema and felt that stenting was warranted as  such, a 6 x 26 double-J stent was placed over the remaining safety wire using cystoscopic and fluoroscopic guidance.  Good proximal and distal curl were noted.  This was set at the dorsum of the penis.  Bladder was emptied per cystoscope.  Procedure was then terminated.  The patient tolerated the procedure well.  There were no immediate procedural complications.  The patient was taken to postanesthesia care unit in stable condition.          ______________________________ Sebastian Ache, MD     TM/MEDQ  D:  02/27/2013  T:  02/28/2013  Job:  [id number]

## 2013-03-03 ENCOUNTER — Encounter (HOSPITAL_BASED_OUTPATIENT_CLINIC_OR_DEPARTMENT_OTHER): Payer: Self-pay

## 2013-03-03 ENCOUNTER — Ambulatory Visit (HOSPITAL_BASED_OUTPATIENT_CLINIC_OR_DEPARTMENT_OTHER): Admit: 2013-03-03 | Payer: Self-pay | Admitting: Urology

## 2013-03-03 SURGERY — CYSTOURETEROSCOPY, WITH RETROGRADE PYELOGRAM AND STENT INSERTION
Anesthesia: General | Laterality: Left

## 2013-03-13 ENCOUNTER — Ambulatory Visit (INDEPENDENT_AMBULATORY_CARE_PROVIDER_SITE_OTHER): Payer: BC Managed Care – PPO | Admitting: Internal Medicine

## 2013-03-13 ENCOUNTER — Encounter: Payer: Self-pay | Admitting: Internal Medicine

## 2013-03-13 VITALS — BP 198/110 | HR 131 | Temp 98.0°F | Wt 183.2 lb

## 2013-03-13 DIAGNOSIS — I10 Essential (primary) hypertension: Secondary | ICD-10-CM

## 2013-03-13 HISTORY — DX: Essential (primary) hypertension: I10

## 2013-03-13 MED ORDER — LOSARTAN POTASSIUM 50 MG PO TABS: 50.0000 mg | ORAL_TABLET | Freq: Every day | ORAL | Status: DC

## 2013-03-13 NOTE — Progress Notes (Signed)
  Subjective:    Patient ID: Willie Dyer, male    DOB: [birth year], 32 y.o.   MRN: [id number]  HPI Here to discuss his blood pressure Since the last time he was here he was diagnosed with kidney stones, already eval and treated by urology. For the last month, his BP has noted to be elevated at the  urologist office and also @ the hospital ---> 160/100  (chart review ) The patient checks his BPs at home,  range from 130/82 to 145/95. He believes he has some degree of whitecoat syndrome  Past Medical History  Diagnosis Date  . Rosacea     symptoms increase w/ beer which he avoicds, dx @ derm  . Ureteral stone     left  . HTN (hypertension) 03/13/2013   Past Surgical History  Procedure Laterality Date  . Lithotripsy  01-30-2013  . Cystoscopy with retrograde pyelogram, ureteroscopy and stent placement Left 02/27/2013    Procedure: CYSTOSCOPY WITH RETROGRADE PYELOGRAM, URETEROSCOPY AND STENT PLACEMENT;  Surgeon: Sebastian Ache, MD;  Location: [facility];  Service: Urology;  Laterality: Left;   NEED DIGITAL URETERSCOPE   . Holmium laser application Left 02/27/2013    Procedure: HOLMIUM LASER APPLICATION;  Surgeon: Sebastian Ache, MD;  Location: [facility];  Service: Urology;  Laterality: Left;   History   Social History  . Marital Status: Married    Spouse Name: N/A    Number of Children: 1  . 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 Smoking status: Never Smoker   . Smokeless tobacco: Never Used  . Alcohol Use: 0.6 oz/week    1 Glasses of wine per week     Comment: socially   . Drug Use: No  . Sexually Active: Yes   Other Topics Concern  . Not on file   Social History Narrative        Review of Systems Diet: does not add salt to food  Exercise : Goes to the gym 3 or 4 times a week, runs 4 miles during the winer , not much exercise  in the last few weeks  No chest pain or shortness or breath No nausea, vomiting,  diarrhea or blood in the stools. Occasionally has a mild headache, no nosebleeds.    Objective:   Physical Exam  BP 198/110  Pulse 131  Temp(Src) 98 F (36.7 C) (Oral)  Wt 183 lb 3.2 oz (83.099 kg)  BMI 24.84 kg/m2  SpO2 95% General -- alert, well-developed, NAD   Lungs -- normal respiratory effort, no intercostal retractions, no accessory muscle use, and normal breath sounds.   Heart-- normal rate, regular rhythm, no murmur, and no gallop.   Abdomen--soft, non-tender, no distention, no bruit.   Extremities-- no pretibial edema bilaterally  Neurologic-- alert & oriented X3 ; Speech, gait and motor are intact. Psych-- Cognition and judgment appear intact. Alert and cooperative with normal attention span and concentration.  not anxious appearing and not depressed appearing.      Assessment & Plan:

## 2013-03-13 NOTE — Patient Instructions (Addendum)
Exercise 3 hours weekly Continue low-salt diet Meloxicam ,Motrin and motrin like medications  can increase your blood pressure, if you have pain you may like to try Tylenol instead. Check the  blood pressure 2 or 3 times a week, be sure it is between 110/60 and 140/85. If it is consistently higher or lower, let me know Arrange  For labs  3 weeks from now (BMP-- dx HTN) Next visit in 3 months.

## 2013-03-13 NOTE — Assessment & Plan Note (Addendum)
Documented elevated BP for the last month at the hospital and the urologist office. He may have a component of white coat syndrome however the blood pressure is quite elevated today. Father and mother have hypertension. Plan:  Check a BMP today and in 3 weeks Start losartan, low dose 50 mg because his outpatient BPs are significantly lower than in the office, again he may have a degree of white coat hypertension. Continue monitoring as an outpatient. Next visit in 3 months

## 2013-03-14 LAB — BASIC METABOLIC PANEL
BUN: 13 mg/dL (ref 6–23)
Chloride: 105 mEq/L (ref 96–112)
Creatinine, Ser: 0.9 mg/dL (ref 0.4–1.5)
Glucose, Bld: 96 mg/dL (ref 70–99)

## 2013-03-20 ENCOUNTER — Telehealth: Payer: Self-pay | Admitting: *Deleted

## 2013-03-20 ENCOUNTER — Encounter: Payer: Self-pay | Admitting: Internal Medicine

## 2013-03-20 MED ORDER — LOSARTAN POTASSIUM 100 MG PO TABS: 100.0000 mg | ORAL_TABLET | Freq: Every day | ORAL | Status: DC

## 2013-03-20 NOTE — Telephone Encounter (Signed)
Message copied by Shirlee More I on Mon Mar 20, 2013  4:53 PM ------      Message from: Willow Ora E      Created: Mon Mar 20, 2013  2:06 PM       Discontinued losartan 50 mg .       send a new prescription losartan 100 mg one by mouth daily #30 and one refill.      The patient knows about the changes. ------

## 2013-03-20 NOTE — Telephone Encounter (Signed)
Please advise 

## 2013-03-20 NOTE — Telephone Encounter (Signed)
Orders placed.

## 2013-03-27 ENCOUNTER — Encounter: Payer: Self-pay | Admitting: Internal Medicine

## 2013-03-27 NOTE — Telephone Encounter (Signed)
Please advise 

## 2013-04-03 ENCOUNTER — Other Ambulatory Visit (INDEPENDENT_AMBULATORY_CARE_PROVIDER_SITE_OTHER): Payer: BC Managed Care – PPO

## 2013-04-03 DIAGNOSIS — I10 Essential (primary) hypertension: Secondary | ICD-10-CM

## 2013-04-03 LAB — BASIC METABOLIC PANEL
CO2: 27 mEq/L (ref 19–32)
Calcium: 8.7 mg/dL (ref 8.4–10.5)
Creatinine, Ser: 1 mg/dL (ref 0.4–1.5)
GFR: 96.42 mL/min (ref >60.00)
Glucose, Bld: 87 mg/dL (ref 70–99)
Sodium: 138 mEq/L (ref 135–145)

## 2013-05-20 ENCOUNTER — Other Ambulatory Visit: Payer: Self-pay | Admitting: Internal Medicine

## 2013-05-22 ENCOUNTER — Encounter: Payer: Self-pay | Admitting: Internal Medicine

## 2013-05-22 ENCOUNTER — Other Ambulatory Visit: Payer: Self-pay | Admitting: *Deleted

## 2013-05-22 MED ORDER — LOSARTAN POTASSIUM 100 MG PO TABS: 100.0000 mg | ORAL_TABLET | Freq: Every day | ORAL | Status: DC

## 2013-05-22 NOTE — Telephone Encounter (Signed)
Losartan refill sent to pharmacy 

## 2013-05-23 ENCOUNTER — Other Ambulatory Visit: Payer: Self-pay | Admitting: *Deleted

## 2013-05-23 MED ORDER — LOSARTAN POTASSIUM 100 MG PO TABS: ORAL_TABLET | ORAL | Status: DC

## 2013-05-23 NOTE — Telephone Encounter (Signed)
Losartan refill sent to pharmacy 

## 2013-06-15 ENCOUNTER — Other Ambulatory Visit: Payer: Self-pay

## 2013-06-20 ENCOUNTER — Telehealth: Payer: Self-pay

## 2013-06-20 NOTE — Telephone Encounter (Signed)
Medication List and allergies: updated and reviewed  90 day supply/mail order: na Local prescriptions: [facility] [address]  Immunizations due: UTD  A/P:   No changes to FH or PSH Flu vaccine--05/2013 Tdap--10/2008 PSA--11/2012--0.36  To Discuss with Provider: Not at this time

## 2013-06-22 ENCOUNTER — Encounter: Payer: Self-pay | Admitting: Internal Medicine

## 2013-06-22 ENCOUNTER — Ambulatory Visit (INDEPENDENT_AMBULATORY_CARE_PROVIDER_SITE_OTHER): Payer: BC Managed Care – PPO | Admitting: Internal Medicine

## 2013-06-22 VITALS — BP 147/96 | HR 86 | Temp 97.2°F | Ht 71.0 in | Wt 182.0 lb

## 2013-06-22 DIAGNOSIS — I10 Essential (primary) hypertension: Secondary | ICD-10-CM

## 2013-06-22 DIAGNOSIS — Z0000 Encounter for general adult medical examination without abnormal findings: Secondary | ICD-10-CM

## 2013-06-22 LAB — COMPREHENSIVE METABOLIC PANEL
ALT: 25 U/L (ref 0–53)
Albumin: 4.2 g/dL (ref 3.5–5.2)
CO2: 25 mEq/L (ref 19–32)
Calcium: 9 mg/dL (ref 8.4–10.5)
Chloride: 105 mEq/L (ref 96–112)
GFR: 97.47 mL/min (ref >60.00)
Glucose, Bld: 100 mg/dL — ABNORMAL HIGH (ref 70–99)
Potassium: 3.8 mEq/L (ref 3.5–5.1)
Sodium: 137 mEq/L (ref 135–145)
Total Bilirubin: 0.8 mg/dL (ref 0.3–1.2)
Total Protein: 7.8 g/dL (ref 6.0–8.3)

## 2013-06-22 LAB — LIPID PANEL
HDL: 42.2 mg/dL (ref >39.00)
Total CHOL/HDL Ratio: 5

## 2013-06-22 LAB — TSH: TSH: 1.29 u[IU]/mL (ref 0.35–5.50)

## 2013-06-22 MED ORDER — LOSARTAN POTASSIUM 100 MG PO TABS: 100.0000 mg | ORAL_TABLET | Freq: Every day | ORAL | Status: DC

## 2013-06-22 NOTE — Patient Instructions (Signed)
Get your blood work before you leave  Next visit in 1 year  for a physical exam. Fasting Please make an appointment     Check the  blood pressure 2 or 3 times a month  be sure it is between 110/60 and 140/85. Ideal blood pressure is 120/80. If it is consistently higher or lower, let me know  Hydrocortisone 1% OTC as needed    Testicular Self-Exam A self-examination of your testicles involves looking at and feeling your testicles for abnormal lumps or swelling. Several things can cause swelling, lumps, or pain in your testicles. Some of these causes are:  Injuries.  Inflammation.  Infection.  Accumulation of fluids around your testicle (hydrocele).  Twisted testicles (testicular torsion).  Testicular cancer. Self-examination of the testicles and groin areas may be advised if you are at risk for testicular cancer. Risks for testicular cancer include:  An undescended testicle (cryptorchidism).  A history of previous testicular cancer.  A family history of testicular cancer. The testicles are easiest to examine after warm baths or showers and are more difficult to examine when you are cold. This is because the muscles attached to the testicles retract and pull them up higher or into the abdomen. Follow these steps while you are standing:  Hold your penis away from your body.  Roll one testicle between your thumb and forefinger, feeling the entire testicle.  Roll the other testicle between your thumb and forefinger, feeling the entire testicle. Feel for lumps, swelling, or discomfort. A normal testicle is egg shaped and feels firm. It is smooth and not tender. The spermatic cord can be felt as a firm spaghetti-like cord at the back of your testicle. It is also important to examine the crease between the front of your leg and your abdomen. Feel for any bumps that are tender. These could be enlarged lymph nodes.  Document Released: 11/02/2000 Document Revised: 03/29/2013 Document  Reviewed: 01/16/2013 [facility] Patient Information 2014 [place], [place].

## 2013-06-22 NOTE — Progress Notes (Signed)
  Subjective:    Patient ID: Willie Dyer, male    DOB: 16-May-1981, 32 y.o.   MRN: [id number]  HPI CPX  Past Medical History  Diagnosis Date  . Rosacea     symptoms increase w/ beer which he avoicds, dx @ derm  . Ureteral stone     left  . HTN (hypertension) 03/13/2013   Past Surgical History  Procedure Laterality Date  . Lithotripsy  01-30-2013  . Cystoscopy with retrograde pyelogram, ureteroscopy and stent placement Left 02/27/2013    Procedure: CYSTOSCOPY WITH RETROGRADE PYELOGRAM, URETEROSCOPY AND STENT PLACEMENT;  Surgeon: Sebastian Ache, MD;  Location: [facility];  Service: Urology;  Laterality: Left;   NEED DIGITAL URETERSCOPE   . Holmium laser application Left 02/27/2013    Procedure: HOLMIUM LASER APPLICATION;  Surgeon: Sebastian Ache, MD;  Location: [facility];  Service: Urology;  Laterality: Left;   Family History  Problem Relation Age of Onset  . Coronary artery disease      GF late in life  . Stroke      GF last in life  . Hypertension Father   . Diabetes Neg Hx   . Colon cancer Neg Hx   . Prostate cancer Neg Hx   . Cancer Neg Hx    History   Social History  . Marital Status: Married    Spouse Name: N/A    Number of Children: 1  . 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 Smoking status: Never Smoker   . Smokeless tobacco: Never Used  . Alcohol Use: 0.6 oz/week    1 Glasses of wine per week     Comment: socially   . Drug Use: No  . Sexual Activity: Yes   Other Topics Concern  . Not on file   Social History Narrative         Review of Systems Diet-- better than before , eats out rarely Exercise-- back going to the gym No  CP, SOB, lower extremity edema Denies  nausea, vomiting diarrhea occ annal pruritus: usually at night, occ sees drops of blood in the toilette paper (-) cough, sputum production No dysuria, gross hematuria, difficulty urinating   No general anxiety (only w/ MD  appointments), no depression         Objective:   Physical Exam BP 147/96  Pulse 86  Temp(Src) 97.2 F (36.2 C)  Ht 5\' 11"  (1.803 m)  Wt 182 lb (82.555 kg)  BMI 25.40 kg/m2  SpO2 100% General -- alert, well-developed, NAD.  Neck --no thyromegaly Lungs -- normal respiratory effort, no intercostal retractions, no accessory muscle use, and normal breath sounds.  Heart-- normal rate, regular rhythm, no murmur.  Abdomen-- Not distended, good bowel sounds,soft, non-tender. No rebound or rigidity. No mass,organomegaly. Rectal-- No external abnormalities noted. Normal sphincter tone. No rectal masses or tenderness. Brown stool  Prostate--Prostate gland firm and smooth, no enlargement, nodularity, tenderness, mass, asymmetry or induration. Extremities-- no pretibial edema bilaterally  Neurologic--  alert & oriented X3.   Psych-- Cognition and judgment appear intact. Cooperative with normal attention span and concentration. No anxious appearing , no depressed appearing.       Assessment & Plan:

## 2013-06-22 NOTE — Assessment & Plan Note (Addendum)
amb BP excellent, ~ 120/70-80, rarely sees is 133/91 Plan: no change, keep checking self BPs, RTC 1 year

## 2013-06-22 NOTE — Progress Notes (Signed)
Pre visit review using our clinic review tool, if applicable. No additional management support is needed unless otherwise documented below in the visit note. 

## 2013-06-22 NOTE — Assessment & Plan Note (Addendum)
Tdap 2010 Had a flu shot already doing great w/ exercise, discussed diet  self testicular exam encouraged Labs  occ drops of blood in the toilette paper, DRE normal, rec observation, call if sx increase

## 2014-06-14 ENCOUNTER — Other Ambulatory Visit: Payer: Self-pay

## 2014-06-14 ENCOUNTER — Encounter: Payer: Self-pay | Admitting: Internal Medicine

## 2014-06-14 MED ORDER — LOSARTAN POTASSIUM 100 MG PO TABS: 100.0000 mg | ORAL_TABLET | Freq: Every day | ORAL | Status: DC

## 2014-06-19 ENCOUNTER — Telehealth: Payer: Self-pay | Admitting: Internal Medicine

## 2014-06-19 MED ORDER — LOSARTAN POTASSIUM 100 MG PO TABS: 100.0000 mg | ORAL_TABLET | Freq: Every day | ORAL | Status: DC

## 2014-06-19 NOTE — Telephone Encounter (Signed)
Pt given 30 days with 3 RFs to get him to next appt in February. Sent to [facility].

## 2014-06-19 NOTE — Telephone Encounter (Addendum)
Caller name: Freida Busmanllen  Relation to pt: self  Call back number: [phone]   Reason for call:   Pt requesting 1 month supply losartan (COZAAR) 100 MG tablet to hold him over until hes CPE 09/28/13

## 2014-09-28 ENCOUNTER — Encounter: Payer: Self-pay | Admitting: Internal Medicine

## 2014-09-28 ENCOUNTER — Ambulatory Visit (INDEPENDENT_AMBULATORY_CARE_PROVIDER_SITE_OTHER): Payer: BC Managed Care – PPO | Admitting: Internal Medicine

## 2014-09-28 VITALS — BP 153/96 | HR 92 | Temp 97.9°F | Ht 71.0 in | Wt 178.2 lb

## 2014-09-28 DIAGNOSIS — Z0000 Encounter for general adult medical examination without abnormal findings: Secondary | ICD-10-CM

## 2014-09-28 LAB — LIPID PANEL
CHOLESTEROL: 188 mg/dL (ref 0–200)
HDL: 32.8 mg/dL — AB (ref >39.00)
LDL Cholesterol: 129 mg/dL — ABNORMAL HIGH (ref 0–99)
NONHDL: 155.2
Total CHOL/HDL Ratio: 6
Triglycerides: 133 mg/dL (ref 0.0–149.0)
VLDL: 26.6 mg/dL (ref 0.0–40.0)

## 2014-09-28 LAB — CBC WITH DIFFERENTIAL/PLATELET
BASOS PCT: 0.4 % (ref 0.0–3.0)
Basophils Absolute: 0 10*3/uL (ref 0.0–0.1)
EOS ABS: 0.2 10*3/uL (ref 0.0–0.7)
Eosinophils Relative: 2.7 % (ref 0.0–5.0)
HCT: 47.2 % (ref 39.0–52.0)
Hemoglobin: 16.3 g/dL (ref 13.0–17.0)
LYMPHS ABS: 1.9 10*3/uL (ref 0.7–4.0)
LYMPHS PCT: 24.5 % (ref 12.0–46.0)
MCHC: 34.5 g/dL (ref 30.0–36.0)
MCV: 84.9 fl (ref 78.0–100.0)
MONO ABS: 0.4 10*3/uL (ref 0.1–1.0)
Monocytes Relative: 5.6 % (ref 3.0–12.0)
NEUTROS ABS: 5.1 10*3/uL (ref 1.4–7.7)
Neutrophils Relative %: 66.8 % (ref 43.0–77.0)
PLATELETS: 265 10*3/uL (ref 150.0–400.0)
RBC: 5.56 Mil/uL (ref 4.22–5.81)
RDW: 13.3 % (ref 11.5–15.5)
WBC: 7.7 10*3/uL (ref 4.0–10.5)

## 2014-09-28 LAB — BASIC METABOLIC PANEL
BUN: 17 mg/dL (ref 6–23)
CHLORIDE: 105 meq/L (ref 96–112)
CO2: 29 meq/L (ref 19–32)
Calcium: 9.5 mg/dL (ref 8.4–10.5)
Creatinine, Ser: 1.04 mg/dL (ref 0.40–1.50)
GFR: 86.1 mL/min (ref >60.00)
GLUCOSE: 99 mg/dL (ref 70–99)
POTASSIUM: 5 meq/L (ref 3.5–5.1)
SODIUM: 140 meq/L (ref 135–145)

## 2014-09-28 MED ORDER — LOSARTAN POTASSIUM 100 MG PO TABS: 100.0000 mg | ORAL_TABLET | Freq: Every day | ORAL | Status: DC

## 2014-09-28 NOTE — Assessment & Plan Note (Addendum)
Tdap 2010 Had a flu shot already doing great w/ exercise, discussed diet , has lost 4 pounds lately self testicular exam encouraged Labs    Other issues: Hypertension, refill losartan, continue checking ambulatory BPs, as long as his BPs are within normal, follow-up in one year Recovering from a URI, Flonase until better, see instructions

## 2014-09-28 NOTE — Progress Notes (Signed)
Pre visit review using our clinic review tool, if applicable. No additional management support is needed unless otherwise documented below in the visit note. 

## 2014-09-28 NOTE — Patient Instructions (Signed)
Get your blood work before you leave    Please schedule the following before you leave the office at the front desk: A office visit to see me  In 1 year  for a   physical exam. Come back fasting    Use Flonase or Nasacort OTC, 2 sprays on each side of the nose daily until you feel better  Check the  blood pressure 2 or 3 times a month  Be sure your blood pressure is between 110/65 and  145/85.  if it is consistently higher or lower, let me know    Testicular Self-Exam A self-examination of your testicles involves looking at and feeling your testicles for abnormal lumps or swelling. Several things can cause swelling, lumps, or pain in your testicles. Some of these causes are:  Injuries.  Inflammation.  Infection.  Accumulation of fluids around your testicle (hydrocele).  Twisted testicles (testicular torsion).  Testicular cancer. Self-examination of the testicles and groin areas may be advised if you are at risk for testicular cancer. Risks for testicular cancer include:  An undescended testicle (cryptorchidism).  A history of previous testicular cancer.  A family history of testicular cancer. The testicles are easiest to examine after warm baths or showers and are more difficult to examine when you are cold. This is because the muscles attached to the testicles retract and pull them up higher or into the abdomen. Follow these steps while you are standing:  Hold your penis away from your body.  Roll one testicle between your thumb and forefinger, feeling the entire testicle.  Roll the other testicle between your thumb and forefinger, feeling the entire testicle. Feel for lumps, swelling, or discomfort. A normal testicle is egg shaped and feels firm. It is smooth and not tender. The spermatic cord can be felt as a firm spaghetti-like cord at the back of your testicle. It is also important to examine the crease between the front of your leg and your abdomen. Feel for any bumps  that are tender. These could be enlarged lymph nodes.  Document Released: 11/02/2000 Document Revised: 03/29/2013 Document Reviewed: 01/16/2013 [facility] Patient Information 2015 [place], [place]. This information is not intended to replace advice given to you by your health care provider. Make sure you discuss any questions you have with your health care provider.

## 2014-09-28 NOTE — Progress Notes (Signed)
Subjective:    Patient ID: Willie Dyer, male    DOB: Dec 22, 1980, 34 y.o.   MRN: [id number]  DOS:  09/28/2014 Type of visit - description : cpx Interval history: Hypertension, good medication compliance, BP today slightly elevated, ambulatory BPs are consistently in the 120s/80. Very rarely DBP goes to 90.   Review of Systems Constitutional: No fever, chills. No unexplained wt changes. No unusual sweats HEENT:  Recovering from a severe URI, currently much better, has a mild cough and some nose and ear pressure. Respiratory: No wheezing or difficulty breathing.   Cardiovascular: No CP, leg swelling or palpitations GI: no nausea, vomiting, diarrhea or abdominal pain.  No blood in the stools. No dysphagia   Endocrine: No polyphagia, polyuria or polydipsia GU: No dysuria, gross hematuria, difficulty urinating. No urinary urgency or frequency. Musculoskeletal: No joint swellings or unusual aches or pains Skin: No change in the color of the skin, palor or rash Allergic, immunologic: No environmental allergies or food allergies Neurological: No dizziness or syncope. No headaches. No diplopia, slurred speech, motor deficits, facial numbness Hematological: No enlarged lymph nodes, easy bruising or bleeding Psychiatry: No suicidal ideas, hallucinations, behavior problems or confusion. No unusual/severe anxiety or depression.     Past Medical History  Diagnosis Date  . Rosacea     symptoms increase w/ beer which he avoicds, dx @ derm  . Ureteral stone     left  . HTN (hypertension) 03/13/2013    Past Surgical History  Procedure Laterality Date  . Lithotripsy  01-30-2013  . Cystoscopy with retrograde pyelogram, ureteroscopy and stent placement Left 02/27/2013    Procedure: CYSTOSCOPY WITH RETROGRADE PYELOGRAM, URETEROSCOPY AND STENT PLACEMENT;  Surgeon: Sebastian Acheheodore Manny, MD;  Location: [facility];  Service: Urology;  Laterality: Left;   NEED DIGITAL URETERSCOPE   . Holmium laser application  Left 02/27/2013    Procedure: HOLMIUM LASER APPLICATION;  Surgeon: Sebastian Acheheodore Manny, MD;  Location: [facility];  Service: Urology;  Laterality: Left;    History   Social History  . Marital Status: Married    Spouse Name: N/A  . Number of Children: 1  . Years of Education: N/A   Occupational History  . 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 Smoking status: Never Smoker   . Smokeless tobacco: Never Used  . Alcohol Use: 0.6 oz/week    1 Glasses of wine per week     Comment: socially   . Drug Use: No  . Sexual Activity: Yes   Other Topics Concern  . Not on file   Social History Narrative   Household-- pt, wife, 3 y/o daughter      Family History  Problem Relation Age of Onset  . Coronary artery disease Other     GF late in life  . Stroke Other     GF last in life  . Hypertension Father   . Diabetes Neg Hx   . Colon cancer Neg Hx   . Prostate cancer Neg Hx   . Cancer Neg Hx        Medication List       This list is accurate as of: 09/28/14 11:59 PM.  Always use your most recent med list.               fish oil-omega-3 fatty acids 1000 MG capsule  Take 1 g by mouth daily.     losartan 100 MG tablet  Commonly known as:  COZAAR  Take 1 tablet (100 mg total) by mouth daily.           Objective:   Physical Exam BP 153/96 mmHg  Pulse 92  Temp(Src) 97.9 F (36.6 C) (Oral)  Ht  (1.803 m)  Wt 178 lb 4 oz (80.854 kg)  BMI 24.87 kg/m2  SpO2 98% General:   Well developed, well nourished . NAD.  Neck:  Full range of motion. Supple. No  thyromegaly , normal carotid pulse, no LAD. HEENT:  Normocephalic . Face symmetric, atraumatic; TMs slightly bulge but no red, no  discharge. Nose is slightly congested. Lungs:  CTA B Normal respiratory effort, no intercostal retractions, no accessory muscle use. Heart: RRR,  no murmur.  Abdomen:  Not distended, soft, non-tender. No rebound or rigidity. No mass,organomegaly Muscle  skeletal: no pretibial edema bilaterally  Skin: Exposed areas without rash. Not pale. Not jaundice Neurologic:  alert & oriented X3.  Speech normal, gait appropriate for age and unassisted Strength symmetric and appropriate for age.  Psych: Cognition and judgment appear intact.  Cooperative with normal attention span and concentration.  Behavior appropriate. No anxious or depressed appearing.        Assessment & Plan:

## 2015-09-18 ENCOUNTER — Other Ambulatory Visit: Payer: Self-pay

## 2015-09-19 ENCOUNTER — Other Ambulatory Visit: Payer: Self-pay

## 2015-09-19 MED ORDER — LOSARTAN POTASSIUM 100 MG PO TABS: 100.0000 mg | ORAL_TABLET | Freq: Every day | ORAL | Status: DC

## 2015-10-25 ENCOUNTER — Encounter: Payer: Self-pay | Admitting: Internal Medicine

## 2015-10-25 MED ORDER — LOSARTAN POTASSIUM 100 MG PO TABS: 100.0000 mg | ORAL_TABLET | Freq: Every day | ORAL | Status: DC

## 2015-10-25 NOTE — Telephone Encounter (Signed)
Rx sent 

## 2015-12-29 ENCOUNTER — Encounter (HOSPITAL_BASED_OUTPATIENT_CLINIC_OR_DEPARTMENT_OTHER): Payer: Self-pay

## 2015-12-29 ENCOUNTER — Emergency Department (HOSPITAL_BASED_OUTPATIENT_CLINIC_OR_DEPARTMENT_OTHER)
Admission: EM | Admit: 2015-12-29 | Discharge: 2015-12-30 | Disposition: A | Discharge status: Home / Self Care | Payer: BC Managed Care – PPO | Attending: Emergency Medicine | Admitting: Emergency Medicine

## 2015-12-29 DIAGNOSIS — N201 Calculus of ureter: Secondary | ICD-10-CM | POA: Insufficient documentation

## 2015-12-29 DIAGNOSIS — I10 Essential (primary) hypertension: Secondary | ICD-10-CM | POA: Insufficient documentation

## 2015-12-29 DIAGNOSIS — R109 Unspecified abdominal pain: Secondary | ICD-10-CM | POA: Diagnosis present

## 2015-12-29 LAB — URINALYSIS, ROUTINE W REFLEX MICROSCOPIC
Bilirubin Urine: NEGATIVE
GLUCOSE, UA: NEGATIVE mg/dL
Hgb urine dipstick: NEGATIVE
KETONES UR: NEGATIVE mg/dL
LEUKOCYTES UA: NEGATIVE
NITRITE: NEGATIVE
PH: 5.5 (ref 5.0–8.0)
Protein, ur: NEGATIVE mg/dL
SPECIFIC GRAVITY, URINE: 1.019 (ref 1.005–1.030)

## 2015-12-29 NOTE — ED Notes (Signed)
Pt reports right sided flank pain. Hx of kidney stones. No back pain previously. Took vicodin at home, no relief. Reports nausea, no vomiting.

## 2015-12-30 ENCOUNTER — Emergency Department (HOSPITAL_BASED_OUTPATIENT_CLINIC_OR_DEPARTMENT_OTHER): Payer: BC Managed Care – PPO

## 2015-12-30 MED ORDER — TAMSULOSIN HCL 0.4 MG PO CAPS: ORAL_CAPSULE | ORAL | Status: DC

## 2015-12-30 MED ORDER — TAMSULOSIN HCL 0.4 MG PO CAPS
0.4000 mg | ORAL_CAPSULE | Freq: Once | ORAL | Status: AC
  Administered 2015-12-30: 0.4 mg via ORAL
  Filled 2015-12-30: qty 1

## 2015-12-30 MED ORDER — ONDANSETRON 8 MG PO TBDP: 8.0000 mg | ORAL_TABLET | Freq: Three times a day (TID) | ORAL | Status: DC | PRN

## 2015-12-30 MED ORDER — OXYCODONE-ACETAMINOPHEN 5-325 MG PO TABS: 1.0000 | ORAL_TABLET | ORAL | Status: DC | PRN

## 2015-12-30 NOTE — ED Notes (Signed)
Pending CT results, alert, NAD, calm, interactive, denies questions or needs.

## 2015-12-30 NOTE — ED Provider Notes (Signed)
CSN: [id number]     Arrival date & time 12/29/15  2157 History  By signing my name below, I, Ronney LionSuzanne Le, attest that this documentation has been prepared under the direction and in the presence of Paula LibraJohn Ashling Roane, MD. Electronically Signed: Ronney LionSuzanne Le, ED Scribe. 12/30/2015. 12:20 AM.    Chief Complaint  Patient presents with  . Flank Pain   The history is provided by the patient. No language interpreter was used.    HPI Comments: Willie Dyer is a 35 y.o. male with a history of left ureteral stone, who presents to the Emergency Department complaining of sudden-onset, severe right flank pain that began about 3 hours ago while sitting at home. He states his pain has improved since onset, which he attributes to either taking a leftover Vicodin pill at home or to a passed stone. He rates his pain as 1/10 presently and 8/10 at its worst. He states he had nausea when his pain was at its highest, but no vomiting. Patient reports he had a ureteral stone about 3 years ago that required cystoscopy with retrograde pyelogram, ureteroscopy and stent placement by Dr. Berneice HeinrichManny at [facility]. He denies hematuria.  Past Medical History  Diagnosis Date  . Rosacea     symptoms increase w/ beer which he avoicds, dx @ derm  . Ureteral stone     left  . HTN (hypertension) 03/13/2013   Past Surgical History  Procedure Laterality Date  . Lithotripsy  01-30-2013  . Cystoscopy with retrograde pyelogram, ureteroscopy and stent placement Left 02/27/2013    Procedure: CYSTOSCOPY WITH RETROGRADE PYELOGRAM, URETEROSCOPY AND STENT PLACEMENT;  Surgeon: Sebastian Acheheodore Manny, MD;  Location: [facility];  Service: Urology;  Laterality: Left;   NEED DIGITAL URETERSCOPE   . Holmium laser application Left 02/27/2013    Procedure: HOLMIUM LASER APPLICATION;  Surgeon: Sebastian Acheheodore Manny, MD;  Location: [facility];  Service: Urology;  Laterality: Left;   Family History  Problem Relation Age of Onset  . Coronary artery disease Other     GF late  in life  . Stroke Other     GF last in life  . Hypertension Father   . Diabetes Neg Hx   . Colon cancer Neg Hx   . Prostate cancer Neg Hx   . Cancer Neg Hx    Social History  Substance Use Topics  . Smoking status: Never Smoker   . Smokeless tobacco: Never Used  . Alcohol Use: 0.6 oz/week    1 Glasses of wine per week     Comment: socially     Review of Systems  A complete 10 system review of systems was obtained and all systems are negative except as noted in the HPI and PMH.    Allergies  Review of patient's allergies indicates no known allergies.  Home Medications   Prior to Admission medications   Medication Sig Start Date End Date Taking? Authorizing Provider  fish oil-omega-3 fatty acids 1000 MG capsule Take 1 g by mouth daily.    Historical Provider, MD  losartan (COZAAR) 100 MG tablet Take 1 tablet (100 mg total) by mouth daily. 10/25/15   Wanda PlumpJose E Paz, MD   BP 141/97 mmHg  Pulse 86  Temp(Src) 98.4 F (36.9 C) (Oral)  Resp 18  Ht 6' (1.829 m)  Wt 182 lb (82.555 kg)  BMI 24.68 kg/m2  SpO2 96%   Physical Exam  Nursing note and vitals reviewed. General: Well-developed, well-nourished male in no acute distress; appearance consistent with  age of record HENT: normocephalic; atraumatic Eyes: pupils equal, round and reactive to light; extraocular muscles intact Neck: supple Heart: regular rate and rhythm Lungs: clear to auscultation bilaterally Abdomen: soft; nondistended; nontender; no masses or hepatosplenomegaly; bowel sounds present GU: no CVA tenderness Extremities: No deformity; full range of motion; pulses normal Neurologic: Awake, alert and oriented; motor function intact in all extremities and symmetric; no facial droop Skin: Warm and dry Psychiatric: Normal mood and affect    ED Course  Procedures (including critical care time)   MDM   Nursing notes and vitals signs, including pulse oximetry, reviewed.  Summary of this visit's results, reviewed  by myself:  Labs:  Results for orders placed or performed during the hospital encounter of 12/29/15 (from the past 24 hour(s))  Urinalysis, Routine w reflex microscopic (not at [facility])     Status: None   Collection Time: 12/29/15 10:05 PM  Result Value Ref Range   Color, Urine YELLOW YELLOW   APPearance CLEAR CLEAR   Specific Gravity, Urine 1.019 1.005 - 1.030   pH 5.5 5.0 - 8.0   Glucose, UA NEGATIVE NEGATIVE mg/dL   Hgb urine dipstick NEGATIVE NEGATIVE   Bilirubin Urine NEGATIVE NEGATIVE   Ketones, ur NEGATIVE NEGATIVE mg/dL   Protein, ur NEGATIVE NEGATIVE mg/dL   Nitrite NEGATIVE NEGATIVE   Leukocytes, UA NEGATIVE NEGATIVE    Imaging Studies: Ct Renal Stone Study  12/30/2015  CLINICAL DATA:  Initial evaluation for acute right flank pain. History of kidney stones. EXAM: CT ABDOMEN AND PELVIS WITHOUT CONTRAST TECHNIQUE: Multidetector CT imaging of the abdomen and pelvis was performed following the standard protocol without IV contrast. COMPARISON:  Prior study from 01/09/2013. FINDINGS: Visualized lung bases are clear. Liver demonstrates a normal unenhanced appearance. Gallbladder within normal limits. No biliary dilatation. Spleen, adrenal glands, and pancreas demonstrate a normal unenhanced appearance. Left kidney unremarkable without evidence of nephrolithiasis or hydronephrosis. No radiopaque calculi seen along the course of the left ureter. There is no left-sided hydroureter. On the right, there is an obstructive 5 mm stone lodged within the proximal right ureter with secondary mild right hydroureteronephrosis. No other radiopaque calculi seen distally along the course of the right ureter. Additional punctate nonobstructive 3 mm stone within the right kidney. Intraluminal fluid density noted within the distal esophageal lumen, which may reflect sequela of reflux disease. Stomach within normal limits. No evidence for bowel obstruction. Appendix normal. Sigmoid diverticulosis without evidence  for acute diverticulitis. No abnormal wall thickening or inflammatory fat stranding seen about the bowels. Bladder within normal limits.  Prostate normal. No free air or fluid. No adenopathy. No acute osseous abnormality. No worrisome lytic or blastic osseous lesions. IMPRESSION: 1. 5 mm obstructive stone within the proximal right ureter with secondary mild right hydro nephrosis. 2. Additional 3 mm nonobstructive right renal calculus. 3. Sigmoid diverticulosis without evidence for acute diverticulitis. 4. Intraluminal fluid density within the visualized distal esophagus, which may reflect sequela of underlying reflux disease. Electronically Signed   By: Rise Mu M.D.   On: 12/30/2015 02:04     Final diagnoses:  Right ureteral stone   I personally performed the services described in this documentation, which was scribed in my presence. The recorded information has been reviewed and is accurate.     Paula Libra, MD 12/30/15 [phone]

## 2015-12-30 NOTE — ED Notes (Signed)
Reports recent R dull back pain and nausea PTA (resolved after vicodin PTA), "feel better now than before", h/o kidney stones 3 yrs ago. (denies: urinary sx, bleeding, dizziness, fever or other sx). Alert, NAD, calm, interactive.

## 2015-12-31 ENCOUNTER — Encounter: Payer: Self-pay | Admitting: Internal Medicine

## 2015-12-31 ENCOUNTER — Other Ambulatory Visit: Payer: Self-pay | Admitting: Internal Medicine

## 2015-12-31 ENCOUNTER — Encounter (HOSPITAL_BASED_OUTPATIENT_CLINIC_OR_DEPARTMENT_OTHER): Payer: Self-pay | Admitting: *Deleted

## 2015-12-31 ENCOUNTER — Other Ambulatory Visit: Payer: Self-pay | Admitting: Urology

## 2015-12-31 MED ORDER — LOSARTAN POTASSIUM 100 MG PO TABS: 100.0000 mg | ORAL_TABLET | Freq: Every day | ORAL | Status: DC

## 2015-12-31 NOTE — Progress Notes (Signed)
NPO AFTER MN.  ARRIVE AT 0700.  NEEDS ISTAT AND EKG.  WILL TAKE AM MEDS W/ SIPS OF WATER DOS AND IF NEEDED TAKE PAIN/ NAUSEA RX.

## 2016-01-01 ENCOUNTER — Ambulatory Visit (HOSPITAL_BASED_OUTPATIENT_CLINIC_OR_DEPARTMENT_OTHER): Payer: BC Managed Care – PPO | Admitting: Anesthesiology

## 2016-01-01 ENCOUNTER — Ambulatory Visit (HOSPITAL_BASED_OUTPATIENT_CLINIC_OR_DEPARTMENT_OTHER)
Admission: RE | Admit: 2016-01-01 | Discharge: 2016-01-01 | Disposition: A | Discharge status: Home / Self Care | Payer: BC Managed Care – PPO | Source: Ambulatory Visit | Attending: Urology | Admitting: Urology

## 2016-01-01 ENCOUNTER — Encounter (HOSPITAL_BASED_OUTPATIENT_CLINIC_OR_DEPARTMENT_OTHER)
Admission: RE | Disposition: A | Discharge status: Home / Self Care | Payer: Self-pay | Source: Ambulatory Visit | Attending: Urology

## 2016-01-01 ENCOUNTER — Encounter (HOSPITAL_BASED_OUTPATIENT_CLINIC_OR_DEPARTMENT_OTHER): Payer: Self-pay | Admitting: *Deleted

## 2016-01-01 DIAGNOSIS — I10 Essential (primary) hypertension: Secondary | ICD-10-CM | POA: Diagnosis not present

## 2016-01-01 DIAGNOSIS — N132 Hydronephrosis with renal and ureteral calculous obstruction: Secondary | ICD-10-CM | POA: Insufficient documentation

## 2016-01-01 DIAGNOSIS — Z87442 Personal history of urinary calculi: Secondary | ICD-10-CM | POA: Diagnosis not present

## 2016-01-01 HISTORY — DX: Personal history of urinary calculi: Z87.442

## 2016-01-01 HISTORY — PX: HOLMIUM LASER APPLICATION: SHX5852

## 2016-01-01 HISTORY — DX: Calculus of ureter: N20.1

## 2016-01-01 HISTORY — PX: CYSTOSCOPY WITH RETROGRADE PYELOGRAM, URETEROSCOPY AND STENT PLACEMENT: SHX5789

## 2016-01-01 HISTORY — DX: Calculus of kidney: N20.0

## 2016-01-01 HISTORY — DX: Presence of spectacles and contact lenses: Z97.3

## 2016-01-01 HISTORY — DX: Gastro-esophageal reflux disease without esophagitis: K21.9

## 2016-01-01 LAB — POCT I-STAT, CHEM 8
BUN: 18 mg/dL (ref 6–20)
CALCIUM ION: 1.15 mmol/L (ref 1.12–1.23)
CHLORIDE: 101 mmol/L (ref 101–111)
Creatinine, Ser: 1.4 mg/dL — ABNORMAL HIGH (ref 0.61–1.24)
Glucose, Bld: 109 mg/dL — ABNORMAL HIGH (ref 65–99)
HCT: 48 % (ref 39.0–52.0)
Hemoglobin: 16.3 g/dL (ref 13.0–17.0)
POTASSIUM: 3.9 mmol/L (ref 3.5–5.1)
SODIUM: 141 mmol/L (ref 135–145)
TCO2: 24 mmol/L (ref 0–100)

## 2016-01-01 SURGERY — CYSTOURETEROSCOPY, WITH RETROGRADE PYELOGRAM AND STENT INSERTION
Anesthesia: General | Laterality: Right

## 2016-01-01 MED ORDER — ONDANSETRON HCL 4 MG/2ML IJ SOLN
INTRAMUSCULAR | Status: DC | PRN
  Administered 2016-01-01: 4 mg via INTRAVENOUS

## 2016-01-01 MED ORDER — GENTAMICIN IN SALINE 1.6-0.9 MG/ML-% IV SOLN
80.0000 mg | INTRAVENOUS | Status: DC
  Filled 2016-01-01: qty 50

## 2016-01-01 MED ORDER — PROPOFOL 10 MG/ML IV BOLUS
INTRAVENOUS | Status: DC | PRN
Start: 2016-01-01 — End: 2016-01-01
  Administered 2016-01-01: 200 mg via INTRAVENOUS

## 2016-01-01 MED ORDER — PROPOFOL 10 MG/ML IV BOLUS
INTRAVENOUS | Status: AC
  Filled 2016-01-01: qty 20

## 2016-01-01 MED ORDER — LIDOCAINE HCL (CARDIAC) 20 MG/ML IV SOLN
INTRAVENOUS | Status: DC | PRN
  Administered 2016-01-01: 60 mg via INTRAVENOUS

## 2016-01-01 MED ORDER — LIDOCAINE HCL (CARDIAC) 20 MG/ML IV SOLN
INTRAVENOUS | Status: AC
  Filled 2016-01-01: qty 5

## 2016-01-01 MED ORDER — WHITE PETROLATUM GEL
Status: AC
  Filled 2016-01-01: qty 5

## 2016-01-01 MED ORDER — MIDAZOLAM HCL 5 MG/5ML IJ SOLN
INTRAMUSCULAR | Status: DC | PRN
  Administered 2016-01-01: 2 mg via INTRAVENOUS

## 2016-01-01 MED ORDER — DIATRIZOATE MEGLUMINE 30 % UR SOLN
URETHRAL | Status: DC | PRN
  Administered 2016-01-01: 10 mL via URETHRAL

## 2016-01-01 MED ORDER — SENNOSIDES-DOCUSATE SODIUM 8.6-50 MG PO TABS: 1.0000 | ORAL_TABLET | Freq: Two times a day (BID) | ORAL | Status: DC

## 2016-01-01 MED ORDER — PROMETHAZINE HCL 25 MG/ML IJ SOLN
6.2500 mg | INTRAMUSCULAR | Status: DC | PRN
  Filled 2016-01-01: qty 1

## 2016-01-01 MED ORDER — FENTANYL CITRATE (PF) 100 MCG/2ML IJ SOLN
INTRAMUSCULAR | Status: AC
  Filled 2016-01-01: qty 2

## 2016-01-01 MED ORDER — KETOROLAC TROMETHAMINE 10 MG PO TABS: 10.0000 mg | ORAL_TABLET | Freq: Four times a day (QID) | ORAL | Status: DC | PRN

## 2016-01-01 MED ORDER — OXYCODONE-ACETAMINOPHEN 5-325 MG PO TABS: 1.0000 | ORAL_TABLET | Freq: Four times a day (QID) | ORAL | Status: DC | PRN

## 2016-01-01 MED ORDER — DEXAMETHASONE SODIUM PHOSPHATE 4 MG/ML IJ SOLN
INTRAMUSCULAR | Status: DC | PRN
  Administered 2016-01-01: 10 mg via INTRAVENOUS

## 2016-01-01 MED ORDER — GENTAMICIN SULFATE 40 MG/ML IJ SOLN
5.0000 mg/kg | INTRAVENOUS | Status: AC
  Administered 2016-01-01: 420 mg via INTRAVENOUS
  Filled 2016-01-01: qty 10.5

## 2016-01-01 MED ORDER — MIDAZOLAM HCL 2 MG/2ML IJ SOLN
INTRAMUSCULAR | Status: AC
  Filled 2016-01-01: qty 2

## 2016-01-01 MED ORDER — LACTATED RINGERS IV SOLN
INTRAVENOUS | Status: DC
  Administered 2016-01-01 (×2): via INTRAVENOUS
  Filled 2016-01-01: qty 1000

## 2016-01-01 MED ORDER — HYDROCODONE-ACETAMINOPHEN 7.5-325 MG PO TABS
1.0000 | ORAL_TABLET | Freq: Once | ORAL | Status: DC | PRN
  Filled 2016-01-01: qty 1

## 2016-01-01 MED ORDER — CEPHALEXIN 500 MG PO CAPS: 500.0000 mg | ORAL_CAPSULE | Freq: Two times a day (BID) | ORAL | Status: DC

## 2016-01-01 MED ORDER — FENTANYL CITRATE (PF) 100 MCG/2ML IJ SOLN
25.0000 ug | INTRAMUSCULAR | Status: DC | PRN
  Filled 2016-01-01: qty 1

## 2016-01-01 MED ORDER — ONDANSETRON HCL 4 MG/2ML IJ SOLN
INTRAMUSCULAR | Status: AC
  Filled 2016-01-01: qty 2

## 2016-01-01 MED ORDER — DEXAMETHASONE SODIUM PHOSPHATE 10 MG/ML IJ SOLN
INTRAMUSCULAR | Status: AC
  Filled 2016-01-01: qty 1

## 2016-01-01 MED ORDER — FENTANYL CITRATE (PF) 100 MCG/2ML IJ SOLN
INTRAMUSCULAR | Status: DC | PRN
  Administered 2016-01-01: 25 ug via INTRAVENOUS
  Administered 2016-01-01: 50 ug via INTRAVENOUS
  Administered 2016-01-01 (×5): 25 ug via INTRAVENOUS

## 2016-01-01 SURGICAL SUPPLY — 37 items
BAG DRAIN URO-CYSTO SKYTR STRL (DRAIN) ×2 IMPLANT
BASKET DAKOTA 1.9FR 11X120 (BASKET) IMPLANT
BASKET LASER NITINOL 1.9FR (BASKET) ×2 IMPLANT
BASKET STNLS GEMINI 4WIRE 3FR (BASKET) IMPLANT
BASKET ZERO TIP NITINOL 2.4FR (BASKET) IMPLANT
CANISTER SUCT LVC 12 LTR MEDI- (MISCELLANEOUS) ×2 IMPLANT
CATH INTERMIT  6FR 70CM (CATHETERS) ×2 IMPLANT
CATH URET 5FR 28IN CONE TIP (BALLOONS)
CATH URET 5FR 28IN OPEN ENDED (CATHETERS) IMPLANT
CATH URET 5FR 70CM CONE TIP (BALLOONS) IMPLANT
CLOTH BEACON ORANGE TIMEOUT ST (SAFETY) ×2 IMPLANT
ELECT REM PT RETURN 9FT ADLT (ELECTROSURGICAL)
ELECTRODE REM PT RTRN 9FT ADLT (ELECTROSURGICAL) IMPLANT
FIBER LASER FLEXIVA 365 (UROLOGICAL SUPPLIES) IMPLANT
FIBER LASER TRAC TIP (UROLOGICAL SUPPLIES) ×2 IMPLANT
GLOVE BIO SURGEON STRL SZ7.5 (GLOVE) ×2 IMPLANT
GOWN STRL REUS W/ TWL LRG LVL3 (GOWN DISPOSABLE) ×2 IMPLANT
GOWN STRL REUS W/ TWL XL LVL3 (GOWN DISPOSABLE) ×1 IMPLANT
GOWN STRL REUS W/TWL LRG LVL3 (GOWN DISPOSABLE) ×2
GOWN STRL REUS W/TWL XL LVL3 (GOWN DISPOSABLE) ×1
GUIDEWIRE 0.038 PTFE COATED (WIRE) IMPLANT
GUIDEWIRE ANG ZIPWIRE 038X150 (WIRE) ×2 IMPLANT
GUIDEWIRE STR DUAL SENSOR (WIRE) ×2 IMPLANT
IV NS IRRIG 3000ML ARTHROMATIC (IV SOLUTION) ×4 IMPLANT
KIT BALLIN UROMAX 15FX10 (LABEL) IMPLANT
KIT BALLN UROMAX 15FX4 (MISCELLANEOUS) IMPLANT
KIT BALLN UROMAX 26 75X4 (MISCELLANEOUS)
KIT ROOM TURNOVER WOR (KITS) ×2 IMPLANT
MANIFOLD NEPTUNE II (INSTRUMENTS) IMPLANT
PACK CYSTO (CUSTOM PROCEDURE TRAY) ×2 IMPLANT
SET HIGH PRES BAL DIL (LABEL)
SHEATH ACCESS URETERAL 38CM (SHEATH) ×2 IMPLANT
STENT POLARIS 5FRX26 (STENTS) ×2 IMPLANT
SYRINGE 10CC LL (SYRINGE) ×2 IMPLANT
SYRINGE IRR TOOMEY STRL 70CC (SYRINGE) IMPLANT
TUBE CONNECTING 12X1/4 (SUCTIONS) IMPLANT
TUBE FEEDING 8FR 16IN STR KANG (MISCELLANEOUS) ×2 IMPLANT

## 2016-01-01 NOTE — Anesthesia Preprocedure Evaluation (Addendum)
Anesthesia Evaluation  Patient identified by MRN, date of birth, ID band Patient awake    Reviewed: Allergy & Precautions, NPO status , Patient's Chart, lab work & pertinent test results  Airway Mallampati: II  TM Distance: >3 FB Neck ROM: Full    Dental   Pulmonary neg pulmonary ROS,    breath sounds clear to auscultation       Cardiovascular hypertension, Pt. on medications  Rhythm:Regular Rate:Normal     Neuro/Psych negative neurological ROS     GI/Hepatic Neg liver ROS, GERD  ,  Endo/Other  negative endocrine ROS  Renal/GU Renal disease (Kidney stones.)     Musculoskeletal   Abdominal   Peds  Hematology negative hematology ROS (+)   Anesthesia Other Findings   Reproductive/Obstetrics                            Lab Results  Component Value Date   WBC 7.7 09/28/2014   HGB 16.3 09/28/2014   HCT 47.2 09/28/2014   MCV 84.9 09/28/2014   PLT 265.0 09/28/2014   Lab Results  Component Value Date   CREATININE 1.04 09/28/2014   BUN 17 09/28/2014   NA 140 09/28/2014   K 5.0 09/28/2014   CL 105 09/28/2014   CO2 29 09/28/2014    Anesthesia Physical Anesthesia Plan  ASA: II  Anesthesia Plan: General   Post-op Pain Management:    Induction: Intravenous  Airway Management Planned: LMA  Additional Equipment:   Intra-op Plan:   Post-operative Plan: Extubation in OR  Informed Consent: I have reviewed the patients History and Physical, chart, labs and discussed the procedure including the risks, benefits and alternatives for the proposed anesthesia with the patient or authorized representative who has indicated his/her understanding and acceptance.   Dental advisory given  Plan Discussed with: CRNA  Anesthesia Plan Comments:         Anesthesia Quick Evaluation

## 2016-01-01 NOTE — Progress Notes (Signed)
Dr. Marcene Duosobert Fitzgerald and Dr.Manny informed of elevated temperature of 99.5.. Ok for patient go home and watch temp, if goes to 101 then call Dr. Berneice HeinrichManny.

## 2016-01-01 NOTE — Anesthesia Procedure Notes (Signed)
Procedure Name: LMA Insertion Date/Time: 01/01/2016 8:31 AM Performed by: Renella CunasHAZEL, Jenella Craigie D Pre-anesthesia Checklist: Patient identified, Emergency Drugs available, Suction available and Patient being monitored Patient Re-evaluated:Patient Re-evaluated prior to inductionOxygen Delivery Method: Circle System Utilized Preoxygenation: Pre-oxygenation with 100% oxygen Intubation Type: IV induction Ventilation: Mask ventilation without difficulty LMA: LMA inserted LMA Size: 4.0 Number of attempts: 1 Airway Equipment and Method: Bite block Placement Confirmation: positive ETCO2 Tube secured with: Tape Dental Injury: Teeth and Oropharynx as per pre-operative assessment

## 2016-01-01 NOTE — Anesthesia Postprocedure Evaluation (Signed)
Anesthesia Post Note  Patient: Idelle Jollen W Schrade Jr.  Procedure(s) Performed: Procedure(s) (LRB): CYSTOSCOPY WITH RIGHT RETROGRADE PYELOGRAM, URETEROSCOPY AND STENT PLACEMENT (Right) HOLMIUM LASER APPLICATION (Right)  Patient location during evaluation: PACU Anesthesia Type: General Level of consciousness: awake and alert Pain management: pain level controlled Vital Signs Assessment: post-procedure vital signs reviewed and stable Respiratory status: spontaneous breathing, nonlabored ventilation and respiratory function stable Cardiovascular status: blood pressure returned to baseline and stable Postop Assessment: no signs of nausea or vomiting Anesthetic complications: no    Last Vitals:  Filed Vitals:   01/01/16 1015 01/01/16 1044  BP: 157/97 150/87  Pulse: 93 92  Temp:    Resp: 18 16    Last Pain:  Filed Vitals:   01/01/16 1044  PainSc: 5                  Kennieth RadFitzgerald, Rosemary Mossbarger E

## 2016-01-01 NOTE — Op Note (Deleted)
NAME:  GREGORY, BARRICK NO.:  [id number]  MEDICAL RECORD NO.:  [id number]  LOCATION:                                 FACILITY:  PHYSICIAN:  Sebastian Ache, MD     DATE OF BIRTH:  12-Jun-1981  DATE OF PROCEDURE: DATE OF DISCHARGE:                              OPERATIVE REPORT   DIAGNOSIS:  Right ureteral stone with refractory flank pain.  PROCEDURE: 1. Cystoscopy with right retrograde pyelogram interpretation. 2. Right ureteroscopy with laser lithotripsy. 3. Insertion of right ureteral stent, 5 x 26 Polaris, no tether.  ESTIMATED BLOOD LOSS:  Nil.  COMPLICATIONS:  None.  SPECIMENS:  Right ureteral and renal stone fragments for compositional analysis.  FINDINGS: 1. Unremarkable urinary bladder. 2. Mild hydronephrosis to filling defect in the proximal ureter     consistent with known stone. 3. Retrograde positioning of proximal ureteral stone into the upper     pole calyx. 4. Complete resolution of all stone fragments larger than 1/3rd mm in     the right kidney and ureter following laser lithotripsy and basket     extraction. 5. Successful placement of right ureteral stent, proximal in renal     pelvis and distal in urinary bladder.  INDICATION:  Mr. Edelen is a very pleasant, 35 year old gentleman with prior history of nephrolithiasis several years ago that was his 1st episode.  He now presents with a prodrome of approximately 1 week of colicky flank pain.  He was found on axial imaging to have a right ureteral stone and several smaller intrarenal stones, dominant stone was approximately 5 mm.  Options were discussed for management including medical therapy versus shockwave lithotripsy versus ureteroscopy, and he adamantly wished to proceed with the latter with the goal of stone free. Informed consent was obtained and placed in medical record.  PROCEDURE IN DETAIL:  The patient being Harpreet Pompey was verified, procedure being right ureteroscopic  stone manipulation was confirmed. Procedure was carried out.  Time-out was performed.  Intravenous antibiotics were administered.  General LMA anesthesia was introduced. The patient was placed into a low lithotomy position.  Sterile field was created by prepping and draping the patient's penis, perineum, proximal thighs using iodine x3.  Next, cystourethroscopy was performed using a 23-French rigid cystoscope with offset lens.  Inspection of the anterior and posterior urethra was unremarkable.  Inspection of the urinary bladder revealed bilateral singleton ureteral orifices.  No papillary lesions noted.  The right ureteral orifice was cannulated with a 6-French catheter and right retrograde pyelogram was obtained.  Right retrograde pyelogram demonstrated a single right ureter with single-system right kidney.  There was mild hydronephrosis to a mobile filling defect in the proximal ureter consistent with known stone.  A 0.038 zip wire was advanced at the level of the upper pole, set aside as a safety wire.  An 8-French feeding tube placed in the urinary bladder for pressure release.  Next, semi-rigid ureteroscopy was performed of the distal 4/5 of the right ureter alongside a separate Sensor working wire.  No significant mucosal abnormalities were found.  The semi-rigid scope was exchanged with a 12/14 medium-sized length ureteral access sheath at the level of proximal  ureter using continuous fluoroscopic guidance.  Flexible digital ureteroscopy was performed of the proximal ureter and systematic inspection of the right kidney including all calices x2.  Using a single channel flexible ureteroscope, this revealed as expected retrograde positioning of prior ureteral stone into the kidney and into an upper pole calyx.  There was also another dominant calcification in the upper pole calyx that was papillary tip and a small calcification in the mid pole calyx.  It was also papillary tip.   The dominant calcification appeared to be too large for simple basketing.  As such, holmium laser energy applied to the stone using a 200 mm laser fiber at settings of 0.3 joules and 30 Hz fragmenting the stone approximately 3 smaller pieces.  The dominant upper pole papillary tip was similarly fragmented.  An escape type basket was used to grasp the stones individually on the long axis and removed in their entirety and set aside for compositional analysis.  Following these maneuvers, there was complete resolution of all stone fragments larger than 1/3rd mm.  There was excellent hemostasis.  No evidence of renal perforation.  The access sheath was removed under continuous ureteroscopic vision, and there was some mild mucosal edema as expected in the area of prior stone impaction.  There was also some mucosal edema near the area of ureteral orifice likely from passage of access sheath.  It was felt that interval stenting would be warranted without tether.  As such, a new 5 x 26 Polaris-type stent was placed remaining safety wire using cystoscopic and fluoroscopic guidance.  Good proximal distal deployment were noted.  Efflux of urine was seen around into the distal of the stent.  Bladder was emptied per cystoscope, procedure terminated.  The patient tolerated the procedure well with no immediate periprocedural complications.  The patient was taken to the postanesthesia care unit in stable condition.    ______________________________ Sebastian Acheheodore Jermany Sundell, MD   ______________________________ Sebastian Acheheodore Sharunda Salmon, MD    TM/MEDQ  D:  01/01/2016  T:  01/01/2016  Job:  [id number]

## 2016-01-01 NOTE — Brief Op Note (Signed)
01/01/2016  9:17 AM  PATIENT:  Willie JoAllen W Gibeault Jr.  35 y.o. male  PRE-OPERATIVE DIAGNOSIS:  RIGHT URETERAL AND RENAL STONES  POST-OPERATIVE DIAGNOSIS:  RIGHT URETERAL AND RENAL STONES  PROCEDURE:  Procedure(s): CYSTOSCOPY WITH RIGHT RETROGRADE PYELOGRAM, URETEROSCOPY AND STENT PLACEMENT (Right) HOLMIUM LASER APPLICATION (Right)  SURGEON:  Surgeon(s) and Role:    * Sebastian Acheheodore Kynedi Profitt, MD - Primary  PHYSICIAN ASSISTANT:   ASSISTANTS: none   ANESTHESIA:   general  EBL:  Total I/O In: 200 [I.V.:200] Out: -   BLOOD ADMINISTERED:none  DRAINS: none   LOCAL MEDICATIONS USED:  NONE  SPECIMEN:  Source of Specimen:  Right ureteral and renal stone fragments  DISPOSITION OF SPECIMEN:  [facility] for compositional analysis  COUNTS:  YES  TOURNIQUET:  * No tourniquets in log *  DICTATION: .Other Dictation: Dictation Number [id number]  PLAN OF CARE: Discharge to home after PACU  PATIENT DISPOSITION:  PACU - hemodynamically stable.   Delay start of Pharmacological VTE agent (>24hrs) due to surgical blood loss or risk of bleeding: yes

## 2016-01-01 NOTE — H&P (Signed)
Willie DuAllen W Dayle PointsJournell Jr. is an 35 y.o. male.    Chief Complaint: Pre-op RIGHT ureteroscopic stone manipulation  HPI:   1 - Recurrent Nephrolithiasis -  01/2013 - SWL, then URS for 5mm distal stone.  12/2015 - Rt 5mm prox ureteral stone (SSC 13cm, 580HU) with ipsilateral punctate renal stone and mild hydro by ER CT on eval colikcy Rt flank pain. Given trial of medicla therapy and reports no interval passage, having to take pain meds regularly.  Most recent UA without infectious parameters.   PMH sig for HTN, GERD, no non-GU surgeries. His PCP is Dr. Drue NovelPaz.    Today Willie Dyer is seen to proceed with right ureteroscopic stone manipulation.   Past Medical History  Diagnosis Date  . Rosacea     symptoms increase w/ beer which he avoicds, dx @ derm  . HTN (hypertension) 03/13/2013  . Right ureteral stone   . History of kidney stones   . GERD (gastroesophageal reflux disease)   . Wears contact lenses   . Nephrolithiasis     right non-obstructive per ct 12-30-2015    Past Surgical History  Procedure Laterality Date  . Lithotripsy  01-30-2013  . Cystoscopy with retrograde pyelogram, ureteroscopy and stent placement Left 02/27/2013    Procedure: CYSTOSCOPY WITH RETROGRADE PYELOGRAM, URETEROSCOPY AND STENT PLACEMENT;  Surgeon: Sebastian Acheheodore Kyndall Chaplin, MD;  Location: [facility];  Service: Urology;  Laterality: Left;   NEED DIGITAL URETERSCOPE   . Holmium laser application Left 02/27/2013    Procedure: HOLMIUM LASER APPLICATION;  Surgeon: Sebastian Acheheodore Izak Anding, MD;  Location: [facility];  Service: Urology;  Laterality: Left;    Family History  Problem Relation Age of Onset  . Coronary artery disease Other     GF late in life  . Stroke Other     GF last in life  . Hypertension Father   . Diabetes Neg Hx   . Colon cancer Neg Hx   . Prostate cancer Neg Hx   . Cancer Neg Hx    Social History:  reports that he has never smoked. He has never used smokeless tobacco. He reports that he drinks about 0.6 oz of alcohol per week.  He reports that he does not use illicit drugs.  Allergies: No Known Allergies  No prescriptions prior to admission    No results found for this or any previous visit (from the past 48 hour(s)). No results found.  Review of Systems  Constitutional: Negative.   HENT: Negative.   Eyes: Negative.   Respiratory: Negative.   Cardiovascular: Negative.   Gastrointestinal: Positive for nausea.  Genitourinary: Positive for flank pain.  Musculoskeletal: Negative.   Skin: Negative.   Neurological: Negative.   Endo/Heme/Allergies: Negative.   Psychiatric/Behavioral: Negative.     There were no vitals taken for this visit. Physical Exam  Constitutional: He is oriented to person, place, and time. He appears well-developed.  HENT:  Head: Normocephalic.  Eyes: Pupils are equal, round, and reactive to light.  Neck: Normal range of motion.  Cardiovascular: Normal rate.   Respiratory: Effort normal.  GI: Soft.  Genitourinary:  Mild Rt CVAT  Musculoskeletal: Normal range of motion.  Neurological: He is alert and oriented to person, place, and time.  Skin: Skin is warm.  Psychiatric: He has a normal mood and affect. His behavior is normal. Judgment and thought content normal.     Assessment/Plan  1 - Recurrent Nephrolithiasis -  We rediscussed ureteroscopic stone manipulation with basketing and laser-lithotripsy in detail.  We rediscussed  risks including bleeding, infection, damage to kidney / ureter  bladder, rarely loss of kidney. We rediscussed anesthetic risks and rare but serious surgical complications including DVT, PE, MI, and mortality. We specifically readdressed that in 5-10% of cases a staged approach is required with stenting followed by re-attempt ureteroscopy if anatomy unfavorable.   The patient voiced understanding and wishes to proceed today as planned.    Sebastian Ache, MD 01/01/2016, 5:59 AM

## 2016-01-01 NOTE — Discharge Instructions (Signed)
1 - You may have urinary urgency (bladder spasms) and bloody urine on / off with stent in place. This is normal. ° °2 - Call MD or go to ER for fever >102, severe pain / nausea / vomiting not relieved by medications, or acute change in medical status °[facility] °[phone] °Post Ureteroscopy With or Without Stent Instructions ° °Definitions: ° °Ureter: The duct that transports urine from the kidney to the bladder. °Stent:   A plastic hollow tube that is placed into the ureter, from the kidney to the                 bladder to prevent the ureter from swelling shut. ° °GENERAL INSTRUCTIONS: ° °Despite the fact that no skin incisions were used, the area around the ureter and bladder is raw and irritated. The stent is a foreign body which will further irritate the bladder wall. This irritation is manifested by increased frequency of urination, both day and night, and by an increase in the urge to urinate. In some, the urge to urinate is present almost always. Sometimes the urge is strong enough that you may not be able to stop yourself from urinating. The only real cure is to remove the stent and then give time for the bladder wall to heal which can't be done until the danger of the ureter swelling shut has passed, which varies. ° °You may see some blood in your urine while the stent is in place and a few days afterwards. Do not be alarmed, even if the urine was clear for a while. Get off your feet and drink lots of fluids until clearing occurs. If you start to pass clots or don't improve, call us. ° °DIET: °You may return to your normal diet immediately. Because of the raw surface of your bladder, alcohol, spicy foods, acid type foods and drinks with caffeine may cause irritation or frequency and should be used in moderation. To keep your urine flowing freely and to avoid constipation, drink plenty of fluids during the day ( 8-10 glasses ). °Tip: Avoid cranberry juice because it is very  acidic. ° °ACTIVITY: °Your physical activity doesn't need to be restricted. However, if you are very active, you may see some blood in your urine. We suggest that you reduce your activity under these circumstances until the bleeding has stopped. ° °BOWELS: °It is important to keep your bowels regular during the postoperative period. Straining with bowel movements can cause bleeding. A bowel movement every other day is reasonable. Use a mild laxative if needed, such as Milk of Magnesia 2-3 tablespoons, or 2 Dulcolax tablets. Call if you continue to have problems. If you have been taking narcotics for pain, before, during or after your surgery, you may be constipated. Take a laxative if necessary. ° ° °MEDICATION: °You should resume your pre-surgery medications unless told not to. In addition you will often be given an antibiotic to prevent infection. These should be taken as prescribed until the bottles are finished unless you are having an unusual reaction to one of the drugs. ° °PROBLEMS YOU SHOULD REPORT TO US: °· Fevers over 100.5 Fahrenheit. °· Heavy bleeding, or clots ( See above notes about blood in urine ). °· Inability to urinate. °· Drug reactions ( hives, rash, nausea, vomiting, diarrhea ). °· Severe burning or pain with urination that is not improving. ° °FOLLOW-UP: °You will need a follow-up appointment to monitor your progress. Call for this appointment at the number listed above.   Usually the first appointment will be about three to fourteen days after your surgery. ° ° ° ° ° °Post Anesthesia Home Care Instructions ° °Activity: °Get plenty of rest for the remainder of the day. A responsible adult should stay with you for 24 hours following the procedure.  °For the next 24 hours, DO NOT: °-Drive a car °-Operate machinery °-Drink alcoholic beverages °-Take any medication unless instructed by your physician °-Make any legal decisions or sign important papers. ° °Meals: °Start with liquid foods such as  gelatin or soup. Progress to regular foods as tolerated. Avoid greasy, spicy, heavy foods. If nausea and/or vomiting occur, drink only clear liquids until the nausea and/or vomiting subsides. Call your physician if vomiting continues. ° °Special Instructions/Symptoms: °Your throat may feel dry or sore from the anesthesia or the breathing tube placed in your throat during surgery. If this causes discomfort, gargle with warm salt water. The discomfort should disappear within 24 hours. ° °If you had a scopolamine patch placed behind your ear for the management of post- operative nausea and/or vomiting: ° °1. The medication in the patch is effective for 72 hours, after which it should be removed.  Wrap patch in a tissue and discard in the trash. Wash hands thoroughly with soap and water. °2. You may remove the patch earlier than 72 hours if you experience unpleasant side effects which may include dry mouth, dizziness or visual disturbances. °3. Avoid touching the patch. Wash your hands with soap and water after contact with the patch. °  ° °

## 2016-01-01 NOTE — Transfer of Care (Signed)
Immediate Anesthesia Transfer of Care Note  Patient: Willie Jollen W Fellenz Jr.  Procedure(s) Performed: Procedure(s) (LRB): CYSTOSCOPY WITH RIGHT RETROGRADE PYELOGRAM, URETEROSCOPY AND STENT PLACEMENT (Right) HOLMIUM LASER APPLICATION (Right)  Patient Location: PACU  Anesthesia Type: General  Level of Consciousness: awake, oriented, sedated and patient cooperative  Airway & Oxygen Therapy: Patient Spontanous Breathing and Patient connected to face mask oxygen  Post-op Assessment: Report given to PACU RN and Post -op Vital signs reviewed and stable  Post vital signs: Reviewed and stable  Complications: No apparent anesthesia complications Last Vitals:  Filed Vitals:   01/01/16 0706 01/01/16 0930  BP: 165/99 152/77  Pulse: 101   Temp: 37.1 C 36.8 C  Resp: 16 21

## 2016-01-01 NOTE — Progress Notes (Signed)
Patient arrived in phase at [id number]

## 2016-01-01 NOTE — Op Note (Signed)
NAME:  Willie Dyer, Willie Dyer              ACCOUNT NO.:  [id number]  MEDICAL RECORD NO.:  [id number]  LOCATION:                                 FACILITY:  PHYSICIAN:  Sebastian Acheheodore Salayah Meares, MD     DATE OF BIRTH:  May 03, 1981  DATE OF PROCEDURE: 01/01/2016                               OPERATIVE REPORT  DIAGNOSIS:  Right ureteral stone with refractory flank pain.  PROCEDURE: 1. Cystoscopy with right retrograde pyelogram and interpretation. 2. Right ureteroscopy with laser lithotripsy. 3. Insertion of right ureteral stent, 5 x 26 Polaris, no tether.  ESTIMATED BLOOD LOSS:  Nil.  COMPLICATIONS:  None.  SPECIMENS:  Right ureteral and renal stone fragments for compositional analysis.  FINDINGS: 1. Unremarkable urinary bladder. 2. Mild hydronephrosis to filling defect in the proximal ureter     consistent with known stone. 3. Retrograde positioning of proximal ureteral stone into the upper     pole calyx. 4. Complete resolution of all stone fragments larger than 1/3rd mm in     the right kidney and ureter following laser lithotripsy and basket     extraction. 5. Successful placement of right ureteral stent, proximal in renal     pelvis and distal in urinary bladder.  INDICATION:  Mr. Willie Dyer is a very pleasant, 35 year old gentleman with prior history of nephrolithiasis several years ago that was his 1st episode.  He now presents with a prodrome of approximately 1 week of colicky flank pain.  He was found on axial imaging to have a right ureteral stone and several smaller intrarenal stones, dominant stone was approximately 5 mm.  Options were discussed for management including medical therapy versus shockwave lithotripsy versus ureteroscopy, and he adamantly wished to proceed with the latter with the goal of stone free. Informed consent was obtained and placed in medical record.  PROCEDURE IN DETAIL:  The patient being Willie Dyer was verified, procedure being right ureteroscopic stone  manipulation was confirmed. Procedure was carried out.  Time-out was performed.  Intravenous antibiotics were administered.  General LMA anesthesia was introduced. The patient was placed into a low lithotomy position.  Sterile field was created by prepping and draping the patient's penis, perineum, proximal thighs using iodine x3.  Next, cystourethroscopy was performed using a 23-French rigid cystoscope with offset lens.  Inspection of the anterior and posterior urethra was unremarkable.  Inspection of the urinary bladder revealed bilateral singleton ureteral orifices.  No papillary lesions noted.  The right ureteral orifice was cannulated with a 6-French catheter and right retrograde pyelogram was obtained.  Right retrograde pyelogram demonstrated a single right ureter with single-system right kidney.  There was mild hydronephrosis to a mobile filling defect in the proximal ureter consistent with known stone.  A 0.038 zip wire was advanced at the level of the upper pole, set aside as a safety wire.  An 8-French feeding tube placed in the urinary bladder for pressure release.  Next, semi-rigid ureteroscopy was performed of the distal 4/5 of the right ureter alongside a separate Sensor working wire.  No significant mucosal abnormalities were found.  The semi-rigid scope was exchanged with a 12/14 medium-sized length ureteral access sheath at the level of proximal ureter  using continuous fluoroscopic guidance.  Flexible digital ureteroscopy was performed of the proximal ureter and systematic inspection of the right kidney including all calices x2.  Using a single channel flexible ureteroscope, this revealed as expected retrograde positioning of prior ureteral stone into the kidney and into an upper pole calyx.  There was also another dominant calcification in the upper pole calyx that was papillary tip and a small calcification in the mid pole calyx.  It was also papillary tip.  The  dominant calcification appeared to be too large for simple basketing.  As such, holmium laser energy applied to the stone using a 200 mm laser fiber at settings of 0.3 joules and 30 Hz fragmenting the stone approximately 3 smaller pieces.  The dominant upper pole papillary tip was similarly fragmented.  An escape type basket was used to grasp the stones individually on the long axis and removed in their entirety and set aside for compositional analysis.  Following these maneuvers, there was complete resolution of all stone fragments larger than 1/3rd mm.  There was excellent hemostasis.  No evidence of renal perforation.  The access sheath was removed under continuous ureteroscopic vision, and there was some mild mucosal edema as expected in the area of prior stone impaction.  There was also some mucosal edema near the area of ureteral orifice likely from passage of access sheath.  It was felt that interval stenting would be warranted without tether.  As such, a new 5 x 26 Polaris-type stent was placed remaining safety wire using cystoscopic and fluoroscopic guidance.  Good proximal distal deployment were noted.  Efflux of urine was seen around into the distal of the stent.  Bladder was emptied per cystoscope, procedure terminated.  The patient tolerated the procedure well with no immediate periprocedural complications.  The patient was taken to the postanesthesia care unit in stable condition.    ______________________________ Sebastian Ache, MD   ______________________________ Sebastian Ache, MD    TM/MEDQ  D:  01/01/2016  T:  01/01/2016  Job:  [id number]

## 2016-01-02 ENCOUNTER — Encounter (HOSPITAL_BASED_OUTPATIENT_CLINIC_OR_DEPARTMENT_OTHER): Payer: Self-pay | Admitting: Urology

## 2016-01-03 ENCOUNTER — Telehealth: Payer: Self-pay | Admitting: Internal Medicine

## 2016-01-03 MED ORDER — LOSARTAN POTASSIUM 100 MG PO TABS: 100.0000 mg | ORAL_TABLET | Freq: Every day | ORAL | Status: DC

## 2016-01-03 NOTE — Telephone Encounter (Signed)
Caller name:Self  Can be reached: [phone]   Pharmacy:  [id number] [place] - [address] [phone] (Phone) [phone] (Fax)         Reason for call: Patient states he communicated with Dr. Drue NovelPaz via My Chart and was told that his   losartan (COZAAR) 100 MG tablet [[id number]][[id number]]      Could be extended until he has his CPE. Patient had to reschedule CPE because [facility]

## 2016-01-03 NOTE — Telephone Encounter (Signed)
Rx sent 

## 2016-01-09 ENCOUNTER — Encounter (HOSPITAL_BASED_OUTPATIENT_CLINIC_OR_DEPARTMENT_OTHER): Payer: Self-pay | Admitting: Urology

## 2016-01-10 ENCOUNTER — Encounter: Payer: BC Managed Care – PPO | Admitting: Internal Medicine

## 2016-03-30 ENCOUNTER — Encounter: Payer: Self-pay | Admitting: Internal Medicine

## 2016-03-30 ENCOUNTER — Ambulatory Visit (INDEPENDENT_AMBULATORY_CARE_PROVIDER_SITE_OTHER): Payer: BC Managed Care – PPO | Admitting: Internal Medicine

## 2016-03-30 VITALS — BP 122/80 | HR 69 | Temp 98.2°F | Resp 14 | Ht 72.0 in | Wt 184.5 lb

## 2016-03-30 DIAGNOSIS — Z0000 Encounter for general adult medical examination without abnormal findings: Secondary | ICD-10-CM

## 2016-03-30 DIAGNOSIS — Z114 Encounter for screening for human immunodeficiency virus [HIV]: Secondary | ICD-10-CM

## 2016-03-30 LAB — HIV ANTIBODY (ROUTINE TESTING W REFLEX): HIV 1&2 Ab, 4th Generation: NONREACTIVE

## 2016-03-30 MED ORDER — LOSARTAN POTASSIUM 100 MG PO TABS: 100.0000 mg | ORAL_TABLET | Freq: Every day | ORAL | 3 refills | Status: DC

## 2016-03-30 NOTE — Patient Instructions (Signed)
GO TO THE LAB : Get the blood work     GO TO THE FRONT DESK Schedule your next appointment for a  complete physical exam in one year    Check the  blood pressure once a months Be sure your blood pressure is between 110/65 and  145/85. If it is consistently higher or lower, let me know

## 2016-03-30 NOTE — Progress Notes (Signed)
Subjective:    Patient ID: Willie JoAllen W Tallarico Jr., male    DOB: 10-Jul-1981, 35 y.o.   MRN: [id number]  DOS:  03/30/2016 Type of visit - description : CPX Interval history: No major concerns, doing well with lifestyle    Review of Systems Constitutional: No fever. No chills. No unexplained wt changes. No unusual sweats  HEENT: No dental problems, no ear discharge, no facial swelling, no voice changes. No eye discharge, no eye  redness , no  intolerance to light   Respiratory: No wheezing , no  difficulty breathing. No cough , no mucus production  Cardiovascular: No CP, no leg swelling , no  Palpitations  GI: no nausea, no vomiting, no diarrhea , no  abdominal pain.  No blood in the stools. No dysphagia, no odynophagia    Endocrine: No polyphagia, no polyuria , no polydipsia  GU: No dysuria, gross hematuria, difficulty urinating. No urinary urgency, no frequency.  Musculoskeletal: No joint swellings or unusual aches or pains  Skin: No change in the color of the skin, palor , no  Rash  Allergic, immunologic: No environmental allergies , no  food allergies  Neurological: No dizziness no  syncope. No headaches. No diplopia, no slurred, no slurred speech, no motor deficits, no facial  Numbness  Hematological: No enlarged lymph nodes, no easy bruising , no unusual bleedings  Psychiatry: No suicidal ideas, no hallucinations, no beavior problems, no confusion.  No unusual/severe anxiety, no depression   Past Medical History:  Diagnosis Date  . GERD (gastroesophageal reflux disease)   . History of kidney stones   . HTN (hypertension) 03/13/2013  . Nephrolithiasis    right non-obstructive per ct 12-30-2015  . Right ureteral stone   . Rosacea    symptoms increase w/ beer which he avoicds, dx @ derm  . Wears contact lenses     Past Surgical History:  Procedure Laterality Date  . CYSTOSCOPY WITH RETROGRADE PYELOGRAM, URETEROSCOPY AND STENT PLACEMENT Left 02/27/2013   Procedure:  CYSTOSCOPY WITH RETROGRADE PYELOGRAM, URETEROSCOPY AND STENT PLACEMENT;  Surgeon: Sebastian Acheheodore Manny, MD;  Location: [facility];  Service: Urology;  Laterality: Left;   NEED DIGITAL URETERSCOPE   . CYSTOSCOPY WITH RETROGRADE PYELOGRAM, URETEROSCOPY AND STENT PLACEMENT Right 01/01/2016   Procedure: CYSTOSCOPY WITH RIGHT RETROGRADE PYELOGRAM, URETEROSCOPY AND STENT PLACEMENT;  Surgeon: Sebastian Acheheodore Manny, MD;  [facility] [place];  Service: Urology;  Laterality: Right;  . HOLMIUM LASER APPLICATION Left 02/27/2013   Procedure: HOLMIUM LASER APPLICATION;  Surgeon: Sebastian Acheheodore Manny, MD;  Location: [facility];  Service: Urology;  Laterality: Left;  . HOLMIUM LASER APPLICATION Right 01/01/2016   Procedure: HOLMIUM LASER APPLICATION;  Surgeon: Sebastian Acheheodore Manny, MD;  [facility] [place];  Service: Urology;  Laterality: Right;  . LITHOTRIPSY  01-30-2013    Social History   Social History  . Marital status: Married    Spouse name: N/A  . Number of children: 1  . Years of education: N/A   Occupational History  . 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 Smoking status: Never Smoker  . Smokeless tobacco: Never Used  . Alcohol use 0.6 oz/week    1 Glasses of wine per week     Comment: socially   . Drug use: No  . Sexual activity: Yes   Other Topics Concern  . Not on file   Social History Narrative   Household-- pt, wife, 694 y/o daughter  Family History  Problem Relation Age of Onset  . Hypertension Father   . CAD Paternal Grandmother     late in life  . Stroke Paternal Grandmother     late in life  . Diabetes Paternal Grandmother   . Colon cancer Neg Hx   . Prostate cancer Neg Hx   . Cancer Neg Hx        Medication List       Accurate as of 03/30/16 11:59 PM. Always use your most recent med list.          esomeprazole 40 MG capsule Commonly known as:  NEXIUM Take 40 mg by mouth every morning.   losartan 100 MG  tablet Commonly known as:  COZAAR Take 1 tablet (100 mg total) by mouth daily.   Potassium Citrate 15 MEQ (1620 MG) Tbcr Take 1 tablet by mouth 2 (two) times daily.          Objective:   Physical Exam BP 122/80 (BP Location: Left Arm, Patient Position: Sitting, Cuff Size: Normal)   Pulse 69   Temp 98.2 F (36.8 C) (Oral)   Resp 14   Ht 6' (1.829 m)   Wt 184 lb 8 oz (83.7 kg)   SpO2 96%   BMI 25.02 kg/m   General:   Well developed, well nourished . NAD.  Neck: No  thyromegaly  HEENT:  Normocephalic . Face symmetric, atraumatic Lungs:  CTA B Normal respiratory effort, no intercostal retractions, no accessory muscle use. Heart: RRR,  no murmur.  No pretibial edema bilaterally  Abdomen:  Not distended, soft, non-tender. No rebound or rigidity.   Skin: Exposed areas without rash. Not pale. Not jaundice Neurologic:  alert & oriented X3.  Speech normal, gait appropriate for age and unassisted Strength symmetric and appropriate for age.  Psych: Cognition and judgment appear intact.  Cooperative with normal attention span and concentration.  Behavior appropriate. No anxious or depressed appearing.    Assessment & Plan:   Assessment HTN GERD Nephrolithiasis, S/P procedures, rx K citrate ~ 03-2016 Rosacea  PLAN: HTN: BP today is excellent, RF losartan. Last creatinine was a slightly elevated in the context of a kidney stone.  BMP today GERD: Sx under excellent control, wonders about long-term side effects of Nexium. We discussed appropriate practices to prevent acid reflux, decrease Nexium to every other day. At some point will try to stop it completely. RTC one year

## 2016-03-30 NOTE — Progress Notes (Signed)
Pre visit review using our clinic review tool, if applicable. No additional management support is needed unless otherwise documented below in the visit note. 

## 2016-03-30 NOTE — Assessment & Plan Note (Addendum)
Tdap 2010 doing great w/ exercise, has improved his diet in the last couple of months.  Previous labs reviewed. Will check a CMP, TSH, HIV.

## 2016-03-31 DIAGNOSIS — Z09 Encounter for follow-up examination after completed treatment for conditions other than malignant neoplasm: Secondary | ICD-10-CM | POA: Insufficient documentation

## 2016-03-31 LAB — COMPREHENSIVE METABOLIC PANEL
ALK PHOS: 51 U/L (ref 39–117)
ALT: 30 U/L (ref 0–53)
AST: 26 U/L (ref 0–37)
Albumin: 4.5 g/dL (ref 3.5–5.2)
BILIRUBIN TOTAL: 0.6 mg/dL (ref 0.2–1.2)
BUN: 13 mg/dL (ref 6–23)
CALCIUM: 9.1 mg/dL (ref 8.4–10.5)
CO2: 26 mEq/L (ref 19–32)
Chloride: 102 mEq/L (ref 96–112)
Creatinine, Ser: 0.97 mg/dL (ref 0.40–1.50)
GFR: 92.53 mL/min (ref >60.00)
GLUCOSE: 78 mg/dL (ref 70–99)
POTASSIUM: 3.9 meq/L (ref 3.5–5.1)
Sodium: 139 mEq/L (ref 135–145)
TOTAL PROTEIN: 8.1 g/dL (ref 6.0–8.3)

## 2016-03-31 LAB — TSH: TSH: 1.36 u[IU]/mL (ref 0.35–4.50)

## 2016-03-31 NOTE — Assessment & Plan Note (Signed)
HTN: BP today is excellent, RF losartan. Last creatinine was a slightly elevated in the context of a kidney stone.  BMP today GERD: Sx under excellent control, wonders about long-term side effects of Nexium. We discussed appropriate practices to prevent acid reflux, decrease Nexium to every other day. At some point will try to stop it completely. RTC one year

## 2016-07-20 ENCOUNTER — Telehealth: Payer: BC Managed Care – PPO | Admitting: Family

## 2016-07-20 DIAGNOSIS — J069 Acute upper respiratory infection, unspecified: Secondary | ICD-10-CM

## 2016-07-20 MED ORDER — FLUTICASONE PROPIONATE 50 MCG/ACT NA SUSP: 2.0000 | Freq: Every day | NASAL | 0 refills | Status: DC

## 2016-07-20 NOTE — Progress Notes (Signed)
E visit for Allergic Rhinitis We are sorry that you are not feeling well.  Her is how we plan to help!  Based on what you have shared with me it looks like you have Allergic Rhinitis.  Rhinitis is when a reaction occurs that causes nasal congestion, runny nose, sneezing, and itching.  Most types of rhinitis are caused by an inflammation and are associated with symptoms in the eyes ears or throat. There are several types of rhinitis.  The most common are acute rhinitis, which is usually caused by a viral illness, allergic or seasonal rhinitis, and nonallergic or year-round rhinitis.  Nasal allergies occur certain times of the year.  Allergic rhinitis is caused when allergens in the air trigger the release of histamine in the body.  Histamine causes itching, swelling, and fluid to build up in the fragile linings of the nasal passages, sinuses and eyelids.  An itchy nose and clear discharge are common.   I also would recommend a nasal spray: Flonase 2 sprays into each nostril once daily   HOME CARE:   You can use an over-the-counter saline nasal spray as needed  Avoid areas where there is heavy dust, mites, or molds  Stay indoors on windy days during the pollen season  Keep windows closed in home, at least in bedroom; use air conditioner.  Use high-efficiency house air filter  Keep windows closed in car, turn AC on re-circulate  Avoid playing out with dog during pollen season  GET HELP RIGHT AWAY IF:   If your symptoms do not improve within 10 days  You become short of breath  You develop yellow or green discharge from your nose for over 3 days  You have coughing fits  MAKE SURE YOU:   Understand these instructions  Will watch your condition  Will get help right away if you are not doing well or get worse  Thank you for choosing an e-visit. Your e-visit answers were reviewed by a board certified advanced clinical practitioner to complete your personal care plan.  Depending upon the condition, your plan could have included both over the counter or prescription medications. Please review your pharmacy choice. Be sure that the pharmacy you have chosen is open so that you can pick up your prescription now.  If there is a problem you may message your provider in MyChart to have the prescription routed to another pharmacy. Your safety is important to us. If you have drug allergies check your prescription carefully.  For the next 24 hours, you can use MyChart to ask questions about today's visit, request a non-urgent call back, or ask for a work or school excuse from your e-visit provider. You will get an email in the next two days asking about your experience. I hope that your e-visit has been valuable and will speed your recovery.         

## 2017-01-05 ENCOUNTER — Other Ambulatory Visit: Payer: Self-pay | Admitting: Family

## 2017-01-05 ENCOUNTER — Telehealth: Payer: BC Managed Care – PPO | Admitting: Family

## 2017-01-05 DIAGNOSIS — L2389 Allergic contact dermatitis due to other agents: Secondary | ICD-10-CM

## 2017-01-05 MED ORDER — PREDNISONE 20 MG PO TABS: 20.0000 mg | ORAL_TABLET | Freq: Every day | ORAL | 0 refills | Status: DC

## 2017-01-05 NOTE — Progress Notes (Signed)
E Visit for Rash  We are sorry that you are not feeling well. Here is how we plan to help!  Based on what you shared with me it looks like you have contact dermatitis.  Contact dermatitis is a skin rash caused by something that touches the skin and causes irritation or inflammation.  Your skin may be red, swollen, dry, cracked, and itch.  The rash should go away in a few days but can last a few weeks.  If you get a rash, it's important to figure out what caused it so the irritant can be avoided in the future.   Prednisone 20 mg daily for 6 days   HOME CARE:   Take cool showers and avoid direct sunlight.  Apply cool compress or wet dressings.  Take a bath in an oatmeal bath.  Sprinkle content of one Aveeno packet under running faucet with comfortably warm water.  Bathe for 15-20 minutes, 1-2 times daily.  Pat dry with a towel. Do not rub the rash.  Use hydrocortisone cream.  Take an antihistamine like Benadryl for widespread rashes that itch.  The adult dose of Benadryl is 25-50 mg by mouth 4 times daily.  Caution:  This type of medication may cause sleepiness.  Do not drink alcohol, drive, or operate dangerous machinery while taking antihistamines.  Do not take these medications if you have prostate enlargement.  Read package instructions thoroughly on all medications that you take.  GET HELP RIGHT AWAY IF:   Symptoms don't go away after treatment.  Severe itching that persists.  If you rash spreads or swells.  If you rash begins to smell.  If it blisters and opens or develops a yellow-brown crust.  You develop a fever.  You have a sore throat.  You become short of breath.  MAKE SURE YOU:  Understand these instructions. Will watch your condition. Will get help right away if you are not doing well or get worse.  Thank you for choosing an e-visit. Your e-visit answers were reviewed by a board certified advanced clinical practitioner to complete your personal care  plan. Depending upon the condition, your plan could have included both over the counter or prescription medications. Please review your pharmacy choice. Be sure that the pharmacy you have chosen is open so that you can pick up your prescription now.  If there is a problem you may message your provider in MyChart to have the prescription routed to another pharmacy. Your safety is important to us. If you have drug allergies check your prescription carefully.  For the next 24 hours, you can use MyChart to ask questions about today's visit, request a non-urgent call back, or ask for a work or school excuse from your e-visit provider. You will get an email in the next two days asking about your experience. I hope that your e-visit has been valuable and will speed your recovery.         

## 2017-04-01 ENCOUNTER — Encounter: Payer: Self-pay | Admitting: Internal Medicine

## 2017-04-01 ENCOUNTER — Ambulatory Visit (INDEPENDENT_AMBULATORY_CARE_PROVIDER_SITE_OTHER): Payer: BC Managed Care – PPO | Admitting: Internal Medicine

## 2017-04-01 VITALS — BP 128/74 | HR 74 | Temp 98.1°F | Resp 14 | Ht 72.0 in | Wt 191.1 lb

## 2017-04-01 DIAGNOSIS — E785 Hyperlipidemia, unspecified: Secondary | ICD-10-CM

## 2017-04-01 DIAGNOSIS — Z0000 Encounter for general adult medical examination without abnormal findings: Secondary | ICD-10-CM

## 2017-04-01 LAB — CBC WITH DIFFERENTIAL/PLATELET
Basophils Absolute: 0 10*3/uL (ref 0.0–0.1)
Basophils Relative: 0.2 % (ref 0.0–3.0)
EOS PCT: 3 % (ref 0.0–5.0)
Eosinophils Absolute: 0.2 10*3/uL (ref 0.0–0.7)
HEMATOCRIT: 48.6 % (ref 39.0–52.0)
HEMOGLOBIN: 16.4 g/dL (ref 13.0–17.0)
LYMPHS ABS: 1.7 10*3/uL (ref 0.7–4.0)
Lymphocytes Relative: 28.7 % (ref 12.0–46.0)
MCHC: 33.7 g/dL (ref 30.0–36.0)
MCV: 88.4 fl (ref 78.0–100.0)
MONOS PCT: 8.4 % (ref 3.0–12.0)
Monocytes Absolute: 0.5 10*3/uL (ref 0.1–1.0)
Neutro Abs: 3.5 10*3/uL (ref 1.4–7.7)
Neutrophils Relative %: 59.7 % (ref 43.0–77.0)
Platelets: 200 10*3/uL (ref 150.0–400.0)
RBC: 5.5 Mil/uL (ref 4.22–5.81)
RDW: 13.6 % (ref 11.5–15.5)
WBC: 5.9 10*3/uL (ref 4.0–10.5)

## 2017-04-01 LAB — LIPID PANEL
Cholesterol: 229 mg/dL — ABNORMAL HIGH (ref 0–200)
HDL: 39 mg/dL — AB (ref >39.00)
LDL Cholesterol: 161 mg/dL — ABNORMAL HIGH (ref 0–99)
NONHDL: 190.16
Total CHOL/HDL Ratio: 6
Triglycerides: 147 mg/dL (ref 0.0–149.0)
VLDL: 29.4 mg/dL (ref 0.0–40.0)

## 2017-04-01 LAB — BASIC METABOLIC PANEL
BUN: 14 mg/dL (ref 6–23)
CALCIUM: 9.8 mg/dL (ref 8.4–10.5)
CO2: 28 mEq/L (ref 19–32)
Chloride: 104 mEq/L (ref 96–112)
Creatinine, Ser: 1.04 mg/dL (ref 0.40–1.50)
GFR: 84.92 mL/min (ref >60.00)
GLUCOSE: 102 mg/dL — AB (ref 70–99)
Potassium: 3.9 mEq/L (ref 3.5–5.1)
SODIUM: 139 meq/L (ref 135–145)

## 2017-04-01 MED ORDER — LOSARTAN POTASSIUM 100 MG PO TABS: 100.0000 mg | ORAL_TABLET | Freq: Every day | ORAL | 3 refills | Status: DC

## 2017-04-01 NOTE — Assessment & Plan Note (Signed)
HTN: Well-controlled with losartan, checking labs GERD: Well controlled with Nexium every other day Nephrolithiasis: Saw his urologist 2 weeks ago, they found no stones this time. RTC one year

## 2017-04-01 NOTE — Progress Notes (Signed)
Pre visit review using our clinic review tool, if applicable. No additional management support is needed unless otherwise documented below in the visit note. 

## 2017-04-01 NOTE — Progress Notes (Signed)
Subjective:    Patient ID: Willie Jo., male    DOB: 05-03-1981, 36 y.o.   MRN: [id number]  DOS:  04/01/2017 Type of visit - description : cpx Interval history: In general feeling very well. Did  have shingles this year, saw dermatology, it wasn't  very painful per pt.  BP Readings from Last 3 Encounters:  04/01/17 128/74  03/30/16 122/80  01/01/16 (!) 150/87     Review of Systems  Other than above, a 14 point review of systems is negative     Past Medical History:  Diagnosis Date  . GERD (gastroesophageal reflux disease)   . History of kidney stones   . HTN (hypertension) 03/13/2013  . Nephrolithiasis    right non-obstructive per ct 12-30-2015  . Right ureteral stone   . Rosacea    symptoms increase w/ beer which he avoicds, dx @ derm  . Wears contact lenses     Past Surgical History:  Procedure Laterality Date  . CYSTOSCOPY WITH RETROGRADE PYELOGRAM, URETEROSCOPY AND STENT PLACEMENT Left 02/27/2013   Procedure: CYSTOSCOPY WITH RETROGRADE PYELOGRAM, URETEROSCOPY AND STENT PLACEMENT;  Surgeon: Sebastian Ache, MD;  Location: [facility];  Service: Urology;  Laterality: Left;   NEED DIGITAL URETERSCOPE   . CYSTOSCOPY WITH RETROGRADE PYELOGRAM, URETEROSCOPY AND STENT PLACEMENT Right 01/01/2016   Procedure: CYSTOSCOPY WITH RIGHT RETROGRADE PYELOGRAM, URETEROSCOPY AND STENT PLACEMENT;  Surgeon: Sebastian Ache, MD;  Location: [facility];  Service: Urology;  Laterality: Right;  . HOLMIUM LASER APPLICATION Left 02/27/2013   Procedure: HOLMIUM LASER APPLICATION;  Surgeon: Sebastian Ache, MD;  Location: [facility];  Service: Urology;  Laterality: Left;  . HOLMIUM LASER APPLICATION Right 01/01/2016   Procedure: HOLMIUM LASER APPLICATION;  Surgeon: Sebastian Ache, MD;  Location: [facility];  Service: Urology;  Laterality: Right;  . LITHOTRIPSY  01-30-2013    Social History   Social History  . Marital status: Married    Spouse name: N/A  . Number of  children: 1  . Years of education: N/A   Occupational History  . 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 Smoking status: Never Smoker  . Smokeless tobacco: Never Used  . Alcohol use 0.6 oz/week    1 Glasses of wine per week     Comment: socially   . Drug use: No  . Sexual activity: Yes   Other Topics Concern  . Not on file   Social History Narrative   Household-- pt, wife, 33 y/o daughter      Family History  Problem Relation Age of Onset  . Hypertension Father   . CAD Paternal Grandmother        late in life  . Stroke Paternal Grandmother        late in life  . Diabetes Paternal Grandmother   . Colon cancer Neg Hx   . Prostate cancer Neg Hx   . Cancer Neg Hx      Allergies as of 04/01/2017   No Known Allergies     Medication List       Accurate as of 04/01/17  8:07 AM. Always use your most recent med list.          esomeprazole 40 MG capsule Commonly known as:  NEXIUM Take 40 mg by mouth every other day.   fluticasone 50 MCG/ACT nasal spray Commonly known as:  FLONASE Place 2 sprays into both nostrils daily.   losartan 100 MG  tablet Commonly known as:  COZAAR Take 1 tablet (100 mg total) by mouth daily.   Potassium Citrate 15 MEQ (1620 MG) Tbcr Take 1 tablet by mouth 2 (two) times daily.   predniSONE 20 MG tablet Commonly known as:  DELTASONE Take 1 tablet (20 mg total) by mouth daily with breakfast.          Objective:   Physical Exam BP 128/74 (BP Location: Left Arm, Patient Position: Sitting, Cuff Size: Small)   Pulse 74   Temp 98.1 F (36.7 C) (Oral)   Resp 14   Ht 6' (1.829 m)   Wt 191 lb 2 oz (86.7 kg)   SpO2 98%   BMI 25.92 kg/m   General:   Well developed, well nourished . NAD.  Neck: No  thyromegaly  HEENT:  Normocephalic . Face symmetric, atraumatic Lungs:  CTA B Normal respiratory effort, no intercostal retractions, no accessory muscle use. Heart: RRR,  no murmur.  No pretibial edema  bilaterally  Abdomen:  Not distended, soft, non-tender. No rebound or rigidity.   Skin: Exposed areas without rash. Not pale. Not jaundice Neurologic:  alert & oriented X3.  Speech normal, gait appropriate for age and unassisted Strength symmetric and appropriate for age.  Psych: Cognition and judgment appear intact.  Cooperative with normal attention span and concentration.  Behavior appropriate. No anxious or depressed appearing.    Assessment & Plan:    Assessment HTN GERD Nephrolithiasis, S/P procedures, rx K citrate ~ 03-2016 Rosacea Shingles, L chest 2018   PLAN: HTN: Well-controlled with losartan, checking labs GERD: Well controlled with Nexium every other day Nephrolithiasis: Saw his urologist 2 weeks ago, they found no stones this time. RTC one year

## 2017-04-01 NOTE — Assessment & Plan Note (Addendum)
-  Tdap 2010 -Labs: BMP, FLP, CBC -Doing well with lifestyle.

## 2017-04-01 NOTE — Patient Instructions (Signed)
GO TO THE LAB : Get the blood work     GO TO THE FRONT DESK Schedule your next appointment for a  physical exam in one year  Check the  blood pressure   monthly weekly   Be sure your blood pressure is between 110/65 and  140/85. If it is consistently higher or lower, let me know

## 2017-04-02 NOTE — Addendum Note (Signed)
Addended byConrad Chelyan D on: 04/02/2017 07:49 AM   Modules accepted: Orders

## 2017-05-09 ENCOUNTER — Other Ambulatory Visit: Payer: Self-pay | Admitting: Internal Medicine

## 2017-06-28 ENCOUNTER — Other Ambulatory Visit (INDEPENDENT_AMBULATORY_CARE_PROVIDER_SITE_OTHER): Payer: BC Managed Care – PPO

## 2017-06-28 DIAGNOSIS — E785 Hyperlipidemia, unspecified: Secondary | ICD-10-CM | POA: Diagnosis not present

## 2017-06-28 LAB — LIPID PANEL
Cholesterol: 210 mg/dL — ABNORMAL HIGH (ref 0–200)
HDL: 41.4 mg/dL (ref >39.00)
LDL Cholesterol: 145 mg/dL — ABNORMAL HIGH (ref 0–99)
NONHDL: 168.33
Total CHOL/HDL Ratio: 5
Triglycerides: 115 mg/dL (ref 0.0–149.0)
VLDL: 23 mg/dL (ref 0.0–40.0)

## 2017-07-30 ENCOUNTER — Encounter: Payer: Self-pay | Admitting: Internal Medicine

## 2018-02-01 IMAGING — CT CT RENAL STONE PROTOCOL
2 of 4 series · 16 of 46 positions shown, 18 images · non-contrast
Comparison: Prior study from 01/09/2013.

CLINICAL DATA: Initial evaluation for acute right flank pain.
History of kidney stones.

EXAM:
CT ABDOMEN AND PELVIS WITHOUT CONTRAST
TECHNIQUE: Multidetector CT imaging of the abdomen and pelvis was performed
following the standard protocol without IV contrast.

[Series 2: axial st · axial · 0.89mm/px · z∈[-490,-75]mm · 13 of 91 slices shown, 15 images]
[im 4/91  soft-tissue]
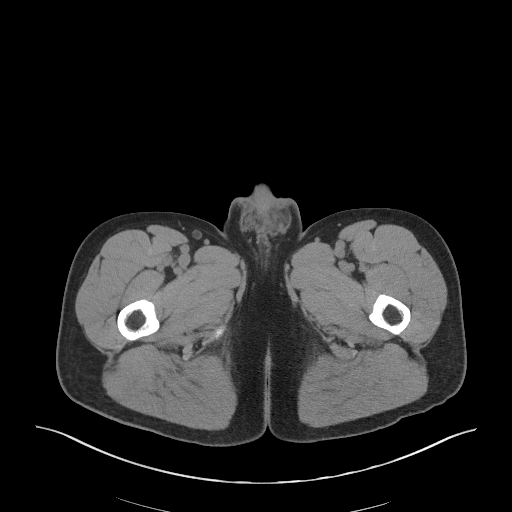
[im 4/91  bone]
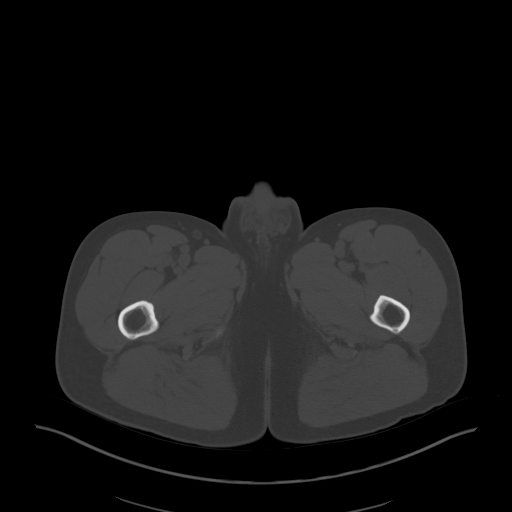
[im 11/91  soft-tissue]
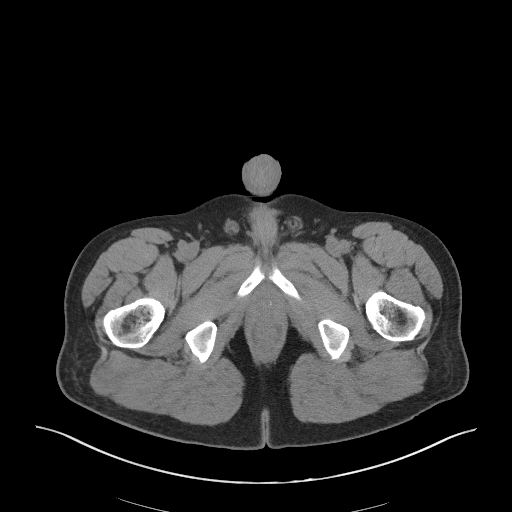
[im 19/91  soft-tissue]
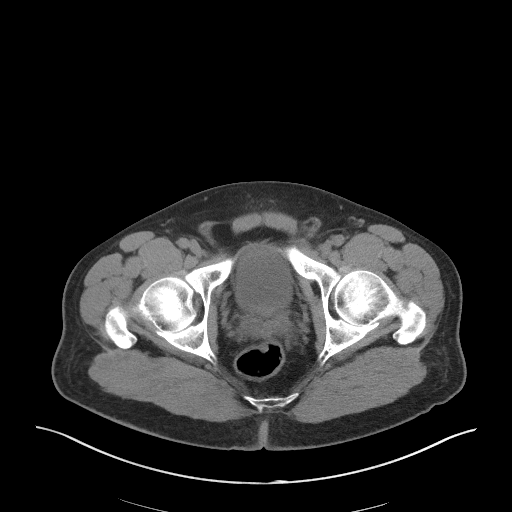
[im 26/91  soft-tissue]
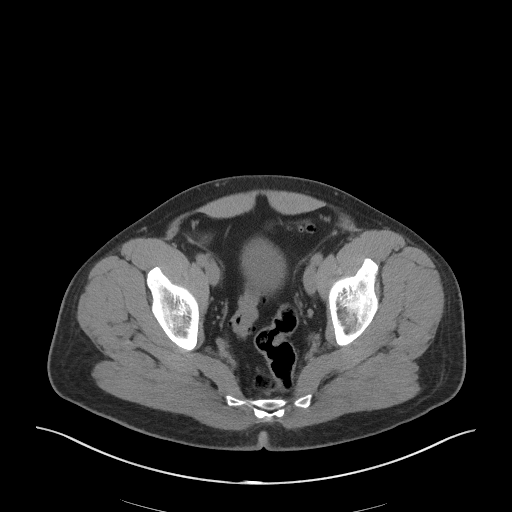
[im 33/91  soft-tissue]
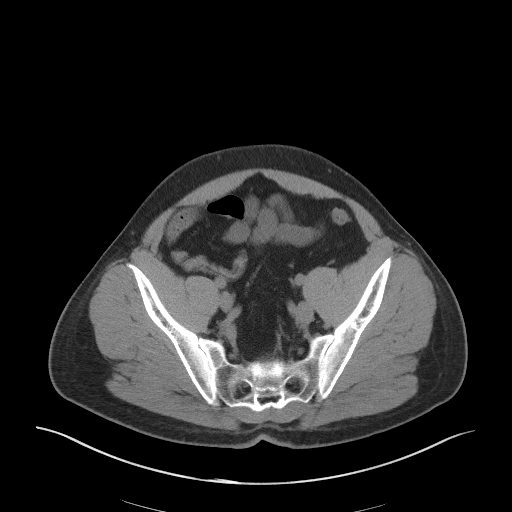
[im 40/91  soft-tissue]
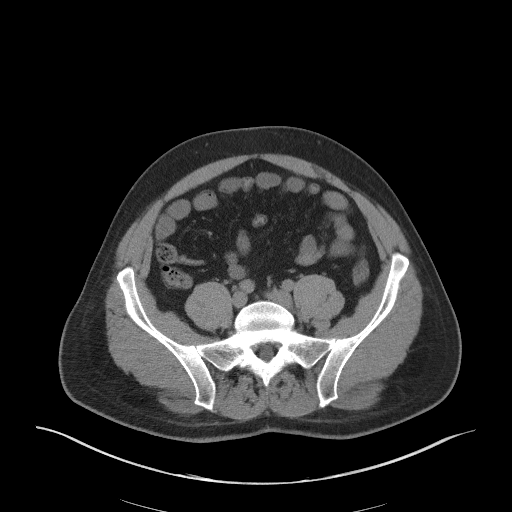
[im 47/91  soft-tissue]
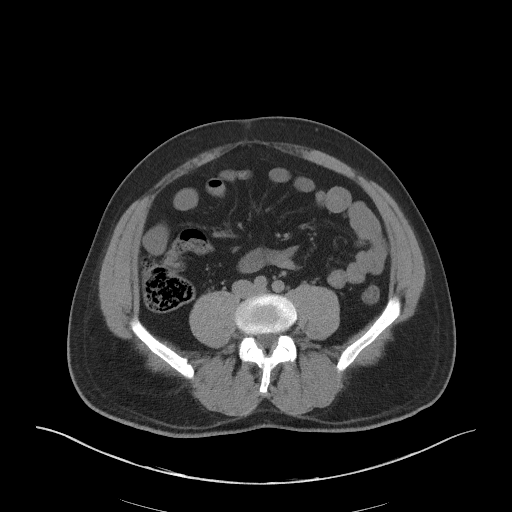
[im 51/91  soft-tissue]
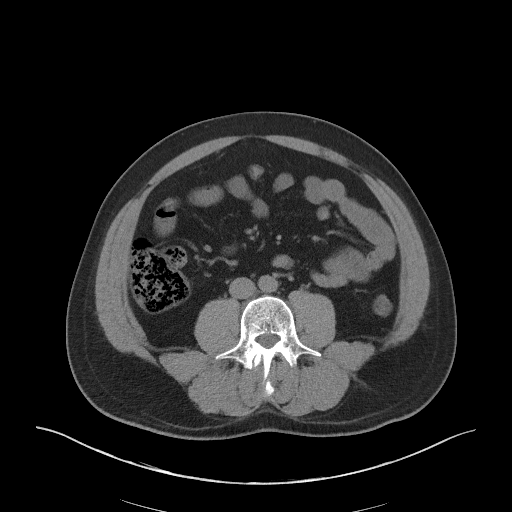
[im 58/91  soft-tissue]
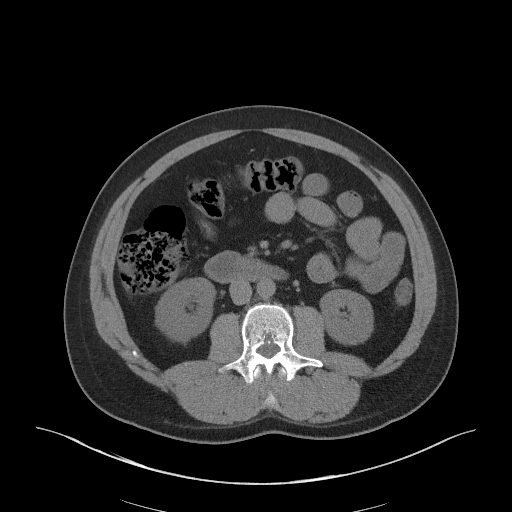
[im 58/91  bone]
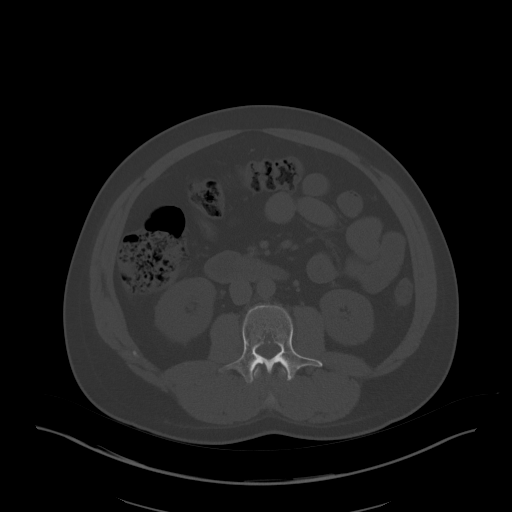
[im 65/91  soft-tissue]
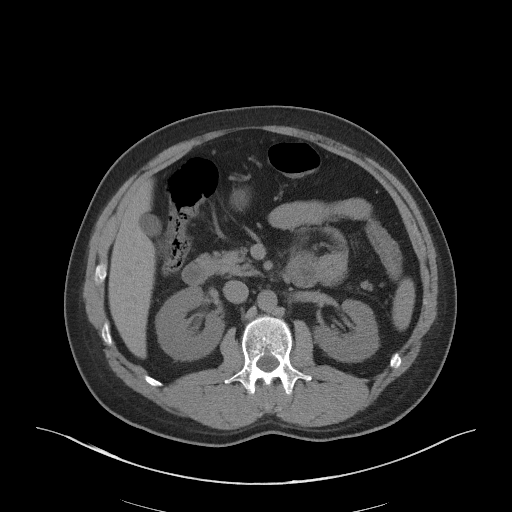
[im 73/91  soft-tissue]
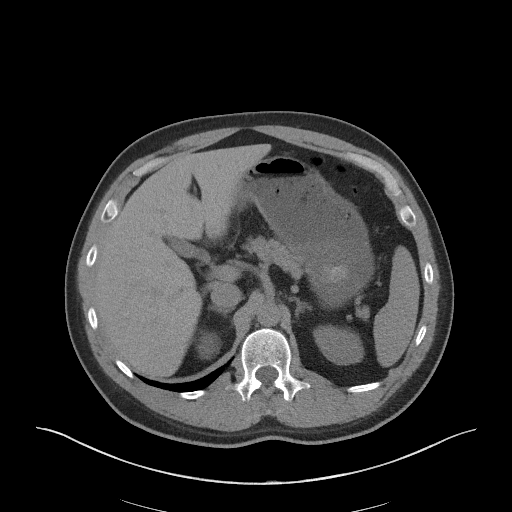
[im 80/91  soft-tissue]
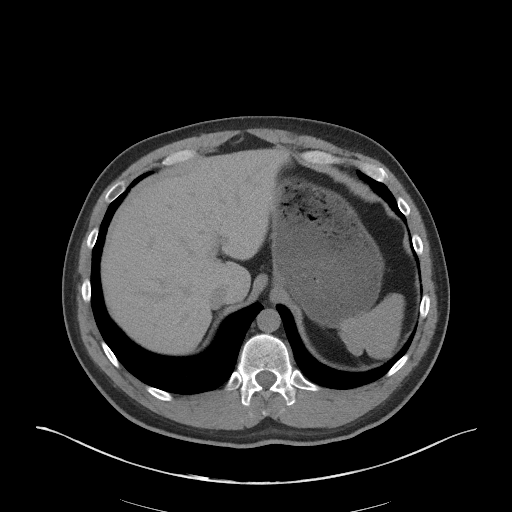
[im 87/91  soft-tissue]
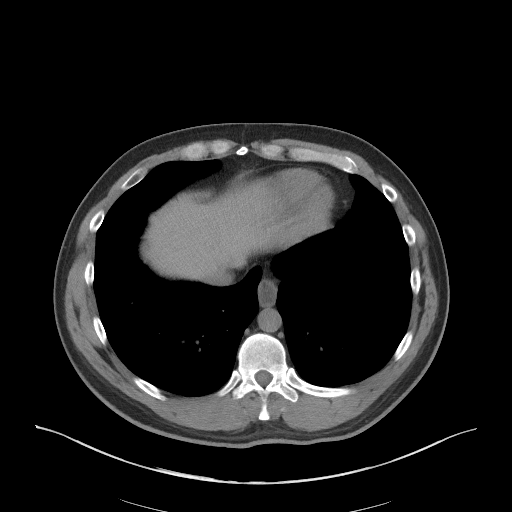

[Series 4: coronal st · coronal · 0.94mm/px · 3 of 99 slices shown]
[im 33/99  soft-tissue]
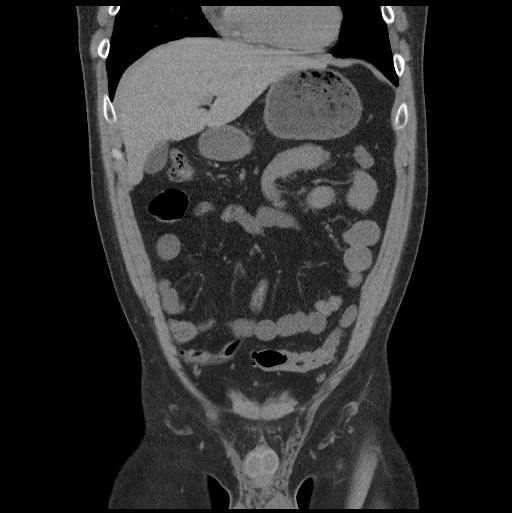
[im 44/99  soft-tissue]
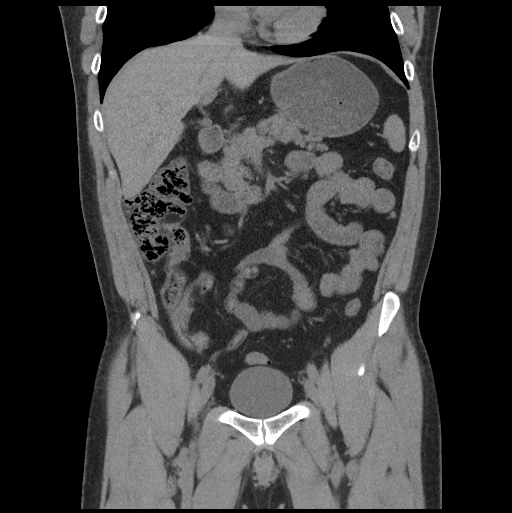
[im 55/99  soft-tissue]
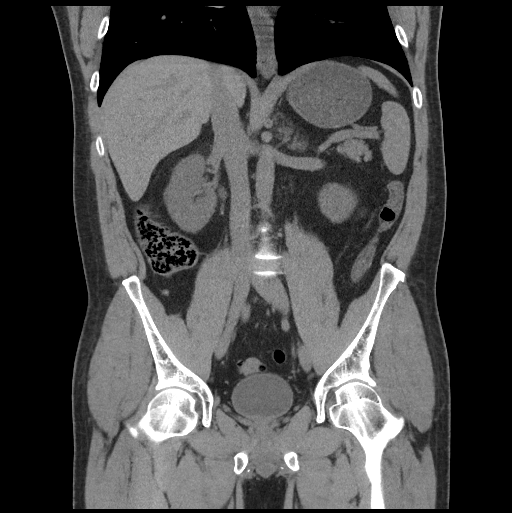

[16 of 46 positions shown; findings below may reference images not displayed]

FINDINGS: Visualized lung bases are clear.

Liver demonstrates a normal unenhanced appearance. Gallbladder
within normal limits. No biliary dilatation. Spleen, adrenal glands,
and pancreas demonstrate a normal unenhanced appearance.

Left kidney unremarkable without evidence of nephrolithiasis or
hydronephrosis. No radiopaque calculi seen along the course of the
left ureter. There is no left-sided hydroureter.

On the right, there is an obstructive 5 mm stone lodged within the
proximal right ureter with secondary mild right
hydroureteronephrosis. No other radiopaque calculi seen distally
along the course of the right ureter. Additional punctate
nonobstructive 3 mm stone within the right kidney.

Intraluminal fluid density noted within the distal esophageal lumen,
which may reflect sequela of reflux disease. Stomach within normal
limits. No evidence for bowel obstruction. Appendix normal. Sigmoid
diverticulosis without evidence for acute diverticulitis. No
abnormal wall thickening or inflammatory fat stranding seen about
the bowels.

Bladder within normal limits.  Prostate normal.

No free air or fluid. No adenopathy. No acute osseous abnormality.
No worrisome lytic or blastic osseous lesions.
IMPRESSION: 1. 5 mm obstructive stone within the proximal right ureter with
secondary mild right hydro nephrosis.
2. Additional 3 mm nonobstructive right renal calculus.
3. Sigmoid diverticulosis without evidence for acute diverticulitis.
4. Intraluminal fluid density within the visualized distal
esophagus, which may reflect sequela of underlying reflux disease.

## 2018-04-06 ENCOUNTER — Encounter: Payer: BC Managed Care – PPO | Admitting: Internal Medicine

## 2018-05-04 ENCOUNTER — Encounter: Payer: Self-pay | Admitting: Internal Medicine

## 2018-05-04 ENCOUNTER — Ambulatory Visit (INDEPENDENT_AMBULATORY_CARE_PROVIDER_SITE_OTHER): Payer: BC Managed Care – PPO | Admitting: Internal Medicine

## 2018-05-04 VITALS — BP 128/82 | HR 68 | Temp 97.8°F | Resp 16 | Ht 72.0 in | Wt 185.0 lb

## 2018-05-04 DIAGNOSIS — Z0000 Encounter for general adult medical examination without abnormal findings: Secondary | ICD-10-CM | POA: Diagnosis not present

## 2018-05-04 LAB — LIPID PANEL
CHOLESTEROL: 232 mg/dL — AB (ref 0–200)
HDL: 46.4 mg/dL (ref >39.00)
LDL CALC: 155 mg/dL — AB (ref 0–99)
NonHDL: 185.47
Total CHOL/HDL Ratio: 5
Triglycerides: 154 mg/dL — ABNORMAL HIGH (ref 0.0–149.0)
VLDL: 30.8 mg/dL (ref 0.0–40.0)

## 2018-05-04 LAB — COMPREHENSIVE METABOLIC PANEL
ALBUMIN: 4.6 g/dL (ref 3.5–5.2)
ALK PHOS: 55 U/L (ref 39–117)
ALT: 17 U/L (ref 0–53)
AST: 17 U/L (ref 0–37)
BILIRUBIN TOTAL: 0.8 mg/dL (ref 0.2–1.2)
BUN: 12 mg/dL (ref 6–23)
CALCIUM: 9.6 mg/dL (ref 8.4–10.5)
CO2: 27 mEq/L (ref 19–32)
CREATININE: 0.89 mg/dL (ref 0.40–1.50)
Chloride: 104 mEq/L (ref 96–112)
GFR: 101.06 mL/min (ref >60.00)
Glucose, Bld: 90 mg/dL (ref 70–99)
Potassium: 4.2 mEq/L (ref 3.5–5.1)
SODIUM: 141 meq/L (ref 135–145)
TOTAL PROTEIN: 7.8 g/dL (ref 6.0–8.3)

## 2018-05-04 LAB — CBC WITH DIFFERENTIAL/PLATELET
BASOS PCT: 0.7 % (ref 0.0–3.0)
Basophils Absolute: 0 10*3/uL (ref 0.0–0.1)
EOS ABS: 0.1 10*3/uL (ref 0.0–0.7)
EOS PCT: 1.8 % (ref 0.0–5.0)
HEMATOCRIT: 49 % (ref 39.0–52.0)
HEMOGLOBIN: 16.6 g/dL (ref 13.0–17.0)
LYMPHS PCT: 26.4 % (ref 12.0–46.0)
Lymphs Abs: 1.5 10*3/uL (ref 0.7–4.0)
MCHC: 33.9 g/dL (ref 30.0–36.0)
MCV: 88 fl (ref 78.0–100.0)
MONOS PCT: 7.5 % (ref 3.0–12.0)
Monocytes Absolute: 0.4 10*3/uL (ref 0.1–1.0)
Neutro Abs: 3.5 10*3/uL (ref 1.4–7.7)
Neutrophils Relative %: 63.6 % (ref 43.0–77.0)
Platelets: 204 10*3/uL (ref 150.0–400.0)
RBC: 5.57 Mil/uL (ref 4.22–5.81)
RDW: 13.9 % (ref 11.5–15.5)
WBC: 5.5 10*3/uL (ref 4.0–10.5)

## 2018-05-04 LAB — TSH: TSH: 1.22 u[IU]/mL (ref 0.35–4.50)

## 2018-05-04 NOTE — Patient Instructions (Signed)
GO TO THE LAB : Get the blood work     GO TO THE FRONT DESK Schedule your next appointment for a  Physical exam in 1 year    Check the  blood pressure  monthly   Be sure your blood pressure is between 110/65 and  135/85. If it is consistently higher or lower, let me know    

## 2018-05-04 NOTE — Progress Notes (Signed)
Pre visit review using our clinic review tool, if applicable. No additional management support is needed unless otherwise documented below in the visit note. 

## 2018-05-04 NOTE — Assessment & Plan Note (Signed)
-  Tdap 2010. Flu shot @ work -Labs: CMP, FLP, CBC, TSH -Doing well with lifestyle.  Goes to the gym, exercise frequently, some weightlifting.

## 2018-05-04 NOTE — Progress Notes (Signed)
Subjective:    Patient ID: Willie Dyer., male    DOB: 1981/04/06, 37 y.o.   MRN: [id number]  DOS:  05/04/2018 Type of visit - description : CPX Interval history: Feel well, no concerns    Review of Systems  A 14 point review of systems is negative    Past Medical History:  Diagnosis Date  . GERD (gastroesophageal reflux disease)   . History of kidney stones   . HTN (hypertension) 03/13/2013  . Nephrolithiasis    right non-obstructive per ct 12-30-2015  . Right ureteral stone   . Rosacea    symptoms increase w/ beer which he avoicds, dx @ derm  . Wears contact lenses     Past Surgical History:  Procedure Laterality Date  . CYSTOSCOPY WITH RETROGRADE PYELOGRAM, URETEROSCOPY AND STENT PLACEMENT Left 02/27/2013   Procedure: CYSTOSCOPY WITH RETROGRADE PYELOGRAM, URETEROSCOPY AND STENT PLACEMENT;  Surgeon: Sebastian Ache, MD;  Location: [facility];  Service: Urology;  Laterality: Left;   NEED DIGITAL URETERSCOPE   . CYSTOSCOPY WITH RETROGRADE PYELOGRAM, URETEROSCOPY AND STENT PLACEMENT Right 01/01/2016   Procedure: CYSTOSCOPY WITH RIGHT RETROGRADE PYELOGRAM, URETEROSCOPY AND STENT PLACEMENT;  Surgeon: Sebastian Ache, MD;  Location: [facility];  Service: Urology;  Laterality: Right;  . HOLMIUM LASER APPLICATION Left 02/27/2013   Procedure: HOLMIUM LASER APPLICATION;  Surgeon: Sebastian Ache, MD;  Location: [facility];  Service: Urology;  Laterality: Left;  . HOLMIUM LASER APPLICATION Right 01/01/2016   Procedure: HOLMIUM LASER APPLICATION;  Surgeon: Sebastian Ache, MD;  Location: [facility];  Service: Urology;  Laterality: Right;  . LITHOTRIPSY  01-30-2013    Social History   Socioeconomic History  . Marital status: Married    Spouse name: Not on file  . Number of children: 1  . Years of education: Not on file  . Highest education level: Not on file  Occupational History  . 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  Financial resource strain: Not on file  . Food insecurity:    Worry: Not on file    Inability: Not on file  . Transportation needs:    Medical: Not on file    Non-medical: Not on file  Tobacco Use  . Smoking status: Never Smoker  . Smokeless tobacco: Never Used  Substance and Sexual Activity  . Alcohol use: Yes    Alcohol/week: 1.0 standard drinks    Types: 1 Glasses of wine per week    Comment: socially   . Drug use: No  . Sexual activity: Yes  Lifestyle  . Physical activity:    Days per week: Not on file    Minutes per session: Not on file  . Stress: Not on file  Relationships  . Social connections:    Talks on phone: Not on file    Gets together: Not on file    Attends religious service: Not on file    Active member of club or organization: Not on file    Attends meetings of clubs or organizations: Not on file    Relationship status: Not on file  . Intimate partner violence:    Fear of current or ex partner: Not on file    Emotionally abused: Not on file    Physically abused: Not on file    Forced sexual activity: Not on file  Other Topics Concern  . Not on file  Social History Narrative   Household-- pt, wife, 2013 daughter  Family History  Problem Relation Age of Onset  . Hypertension Father   . CAD Paternal Grandmother        late in life  . Stroke Paternal Grandmother        late in life  . Diabetes Paternal Grandmother   . Colon cancer Neg Hx   . Prostate cancer Neg Hx   . Cancer Neg Hx      Allergies as of 05/04/2018   No Known Allergies     Medication List        Accurate as of 05/04/18 11:59 PM. Always use your most recent med list.          esomeprazole 40 MG capsule Commonly known as:  NEXIUM Take 40 mg by mouth every other day.   losartan 100 MG tablet Commonly known as:  COZAAR Take 1 tablet (100 mg total) by mouth daily.   Potassium Citrate 15 MEQ (1620 MG) Tbcr Take 1 tablet by mouth 2 (two) times daily.            Objective:   Physical Exam BP 128/82 (BP Location: Left Arm, Patient Position: Sitting, Cuff Size: Normal)   Pulse 68   Temp 97.8 F (36.6 C) (Oral)   Resp 16   Ht 6' (1.829 m)   Wt 185 lb (83.9 kg)   SpO2 98%   BMI 25.09 kg/m  General: Well developed, NAD, see BMI.  Neck: No  thyromegaly  HEENT:  Normocephalic . Face symmetric, atraumatic.  Xanthelasma type of lesions noted around the eyes. Lungs:  CTA B Normal respiratory effort, no intercostal retractions, no accessory muscle use. Heart: RRR,  no murmur.  No pretibial edema bilaterally  Abdomen:  Not distended, soft, non-tender. No rebound or rigidity.   Skin: Exposed areas without rash. Not pale. Not jaundice Neurologic:  alert & oriented X3.  Speech normal, gait appropriate for age and unassisted Strength symmetric and appropriate for age.  Psych: Cognition and judgment appear intact.  Cooperative with normal attention span and concentration.  Behavior appropriate. No anxious or depressed appearing.     Assessment & Plan:   Assessment HTN GERD Nephrolithiasis, S/P procedures, rx K citrate ~ 03-2016 Rosacea Shingles, L chest 2018 Plane xanthelasma  PLAN: HTN: On losartan, ambulatory BPs when checked in the 120s, well controlled.  Wonders if he could stop losartan.  Recommend to monitor BPs frequently, if the numbers are consistently 110s, okay to decrease losartan to 50. GERD: Well controlled with every other day PPIs Xanthelasma: Per literature, monitor for blood dyscrasias, checking a CBC today. RTC 1 year

## 2018-05-05 NOTE — Assessment & Plan Note (Signed)
HTN: On losartan, ambulatory BPs when checked in the 120s, well controlled.  Wonders if he could stop losartan.  Recommend to monitor BPs frequently, if the numbers are consistently 110s, okay to decrease losartan to 50. GERD: Well controlled with every other day PPIs Xanthelasma: Per literature, monitor for blood dyscrasias, checking a CBC today. RTC 1 year

## 2018-06-09 ENCOUNTER — Other Ambulatory Visit: Payer: Self-pay | Admitting: Internal Medicine

## 2019-03-08 ENCOUNTER — Other Ambulatory Visit: Payer: Self-pay | Admitting: Internal Medicine

## 2019-03-08 NOTE — Telephone Encounter (Signed)
Rx sent. Mychart message sent to Pt.  

## 2019-03-08 NOTE — Telephone Encounter (Signed)
Okay to send Irbesartan  300 mg 1 tablet daily #30 and 1 refill. Stop losartan Please ask patient to monitor BPs let me know in 2 weeks how they are going.  Just like to be sure that the new medication works well for him.  If BPs are too low we may need to decrease dose.

## 2019-03-08 NOTE — Telephone Encounter (Signed)
Losartan 100mg  on back order- [facility] requesting to change to irbesartan 300mg ?

## 2019-04-02 ENCOUNTER — Other Ambulatory Visit: Payer: Self-pay | Admitting: Internal Medicine

## 2019-04-21 ENCOUNTER — Encounter: Payer: Self-pay | Admitting: Internal Medicine

## 2019-05-09 ENCOUNTER — Other Ambulatory Visit: Payer: Self-pay

## 2019-05-10 ENCOUNTER — Ambulatory Visit (INDEPENDENT_AMBULATORY_CARE_PROVIDER_SITE_OTHER): Payer: BC Managed Care – PPO | Admitting: Internal Medicine

## 2019-05-10 ENCOUNTER — Encounter: Payer: Self-pay | Admitting: Internal Medicine

## 2019-05-10 VITALS — BP 138/88 | HR 72 | Temp 96.9°F | Resp 16 | Ht 72.0 in | Wt 186.2 lb

## 2019-05-10 DIAGNOSIS — Z0000 Encounter for general adult medical examination without abnormal findings: Secondary | ICD-10-CM | POA: Diagnosis not present

## 2019-05-10 DIAGNOSIS — Z23 Encounter for immunization: Secondary | ICD-10-CM | POA: Diagnosis not present

## 2019-05-10 LAB — COMPREHENSIVE METABOLIC PANEL
ALT: 16 U/L (ref 0–53)
AST: 16 U/L (ref 0–37)
Albumin: 4.4 g/dL (ref 3.5–5.2)
Alkaline Phosphatase: 53 U/L (ref 39–117)
BUN: 13 mg/dL (ref 6–23)
CO2: 25 mEq/L (ref 19–32)
Calcium: 9.4 mg/dL (ref 8.4–10.5)
Chloride: 103 mEq/L (ref 96–112)
Creatinine, Ser: 1 mg/dL (ref 0.40–1.50)
GFR: 82.69 mL/min (ref >60.00)
Glucose, Bld: 99 mg/dL (ref 70–99)
Potassium: 4.4 mEq/L (ref 3.5–5.1)
Sodium: 139 mEq/L (ref 135–145)
Total Bilirubin: 1 mg/dL (ref 0.2–1.2)
Total Protein: 7.5 g/dL (ref 6.0–8.3)

## 2019-05-10 LAB — CBC WITH DIFFERENTIAL/PLATELET
Basophils Absolute: 0 10*3/uL (ref 0.0–0.1)
Basophils Relative: 0.4 % (ref 0.0–3.0)
Eosinophils Absolute: 0.2 10*3/uL (ref 0.0–0.7)
Eosinophils Relative: 2.5 % (ref 0.0–5.0)
HCT: 47.4 % (ref 39.0–52.0)
Hemoglobin: 15.9 g/dL (ref 13.0–17.0)
Lymphocytes Relative: 27.1 % (ref 12.0–46.0)
Lymphs Abs: 1.7 10*3/uL (ref 0.7–4.0)
MCHC: 33.6 g/dL (ref 30.0–36.0)
MCV: 88.9 fl (ref 78.0–100.0)
Monocytes Absolute: 0.6 10*3/uL (ref 0.1–1.0)
Monocytes Relative: 8.6 % (ref 3.0–12.0)
Neutro Abs: 3.9 10*3/uL (ref 1.4–7.7)
Neutrophils Relative %: 61.4 % (ref 43.0–77.0)
Platelets: 202 10*3/uL (ref 150.0–400.0)
RBC: 5.33 Mil/uL (ref 4.22–5.81)
RDW: 13.9 % (ref 11.5–15.5)
WBC: 6.4 10*3/uL (ref 4.0–10.5)

## 2019-05-10 LAB — LIPID PANEL
Cholesterol: 197 mg/dL (ref 0–200)
HDL: 42.9 mg/dL (ref >39.00)
LDL Cholesterol: 125 mg/dL — ABNORMAL HIGH (ref 0–99)
NonHDL: 153.96
Total CHOL/HDL Ratio: 5
Triglycerides: 145 mg/dL (ref 0.0–149.0)
VLDL: 29 mg/dL (ref 0.0–40.0)

## 2019-05-10 NOTE — Assessment & Plan Note (Addendum)
-  Tdap today -Flu shot today -Labs: CMP, FLP, CBC -Doing well with lifestyle. Gyms  are closed but he continue walking. - d/w pt his CV RF 10 years is 2.9% thus he is okay, I still would like to see LDL closer to 130

## 2019-05-10 NOTE — Patient Instructions (Addendum)
GO TO THE LAB : Get the blood work     GO TO THE FRONT DESK Schedule your next appointment   for a physical exam in 1 year  Wean off Nexium and go back on it if needed  Continue checking your blood pressures regularly, ~ twice a month   BP GOAL is between 110/65 and  135/85. If it is consistently higher or lower, let me know

## 2019-05-10 NOTE — Progress Notes (Signed)
Subjective:    Patient ID: Willie Jo., male    DOB: June 10, 1981, 38 y.o.   MRN: [id number]  DOS:  05/10/2019 Type of visit - description: CPX In general doing well Unable to go to the gym due to the quarantine but he is taking longer walks. From time to time has left knee discomfort, no swelling per se. BP today is elevated, at home is consistently normal in the 120s/80s.  Review of Systems  Other than above, a 14 point review of systems is negative     Past Medical History:  Diagnosis Date  . GERD (gastroesophageal reflux disease)   . History of kidney stones   . HTN (hypertension) 03/13/2013  . Nephrolithiasis    right non-obstructive per ct 12-30-2015  . Right ureteral stone   . Rosacea    symptoms increase w/ beer which he avoicds, dx @ derm  . Wears contact lenses     Past Surgical History:  Procedure Laterality Date  . CYSTOSCOPY WITH RETROGRADE PYELOGRAM, URETEROSCOPY AND STENT PLACEMENT Left 02/27/2013   Procedure: CYSTOSCOPY WITH RETROGRADE PYELOGRAM, URETEROSCOPY AND STENT PLACEMENT;  Surgeon: Sebastian Ache, MD;  Location: [facility];  Service: Urology;  Laterality: Left;   NEED DIGITAL URETERSCOPE   . CYSTOSCOPY WITH RETROGRADE PYELOGRAM, URETEROSCOPY AND STENT PLACEMENT Right 01/01/2016   Procedure: CYSTOSCOPY WITH RIGHT RETROGRADE PYELOGRAM, URETEROSCOPY AND STENT PLACEMENT;  Surgeon: Sebastian Ache, MD;  Location: [facility];  Service: Urology;  Laterality: Right;  . HOLMIUM LASER APPLICATION Left 02/27/2013   Procedure: HOLMIUM LASER APPLICATION;  Surgeon: Sebastian Ache, MD;  Location: [facility];  Service: Urology;  Laterality: Left;  . HOLMIUM LASER APPLICATION Right 01/01/2016   Procedure: HOLMIUM LASER APPLICATION;  Surgeon: Sebastian Ache, MD;  Location: [facility];  Service: Urology;  Laterality: Right;  . LITHOTRIPSY  01-30-2013    Social History   Socioeconomic History  . Marital status: Married    Spouse name: Not  on file  . Number of children: 1  . Years of education: Not on file  . Highest education level: Not on file  Occupational History  . 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 Financial resource strain: Not on file  . Food insecurity    Worry: Not on file    Inability: Not on file  . Transportation needs    Medical: Not on file    Non-medical: Not on file  Tobacco Use  . Smoking status: Never Smoker  . Smokeless tobacco: Never Used  Substance and Sexual Activity  . Alcohol use: Yes    Alcohol/week: 1.0 standard drinks    Types: 1 Glasses of wine per week    Comment: socially   . Drug use: No  . Sexual activity: Yes  Lifestyle  . Physical activity    Days per week: Not on file    Minutes per session: Not on file  . Stress: Not on file  Relationships  . Social Musician on phone: Not on file    Gets together: Not on file    Attends religious service: Not on file    Active member of club or organization: Not on file    Attends meetings of clubs or organizations: Not on file    Relationship status: Not on file  . Intimate partner violence    Fear of current or ex partner: Not on file    Emotionally abused: Not on file  Physically abused: Not on file    Forced sexual activity: Not on file  Other Topics Concern  . Not on file  Social History Narrative   Household-- pt, wife, 2013 daughter      Family History  Problem Relation Age of Onset  . Hypertension Father   . CAD Paternal Grandmother        late in life  . Stroke Paternal Grandmother        late in life  . Diabetes Paternal Grandmother   . Colon cancer Neg Hx   . Prostate cancer Neg Hx   . Cancer Neg Hx     Allergies as of 05/10/2019   No Known Allergies     Medication List       Accurate as of May 10, 2019  5:07 PM. If you have any questions, ask your nurse or doctor.        esomeprazole 40 MG capsule Commonly known as: NEXIUM Take 40 mg by mouth  every other day.   irbesartan 300 MG tablet Commonly known as: AVAPRO Take 1 tablet (300 mg total) by mouth daily.   Potassium Citrate 15 MEQ (1620 MG) Tbcr Take 1 tablet by mouth 2 (two) times daily.           Objective:   Physical Exam BP 138/88 (BP Location: Left Arm)   Pulse 72   Temp (!) 96.9 F (36.1 C) (Temporal)   Resp 16   Ht 6' (1.829 m)   Wt 186 lb 4 oz (84.5 kg)   SpO2 100%   BMI 25.26 kg/m  General: Well developed, NAD, BMI noted Neck: No  thyromegaly  HEENT:  Normocephalic . Face symmetric, atraumatic Lungs:  CTA B Normal respiratory effort, no intercostal retractions, no accessory muscle use. Heart: RRR,  no murmur.  No pretibial edema bilaterally  Abdomen:  Not distended, soft, non-tender. No rebound or rigidity.   Skin: Exposed areas without rash. Not pale. Not jaundice MSK: Knees symmetric, no effusion, both are stable. Neurologic:  alert & oriented X3.  Speech normal, gait appropriate for age and unassisted Strength symmetric and appropriate for age.  Psych: Cognition and judgment appear intact.  Cooperative with normal attention span and concentration.  Behavior appropriate. No anxious or depressed appearing.     Assessment     Assessment HTN GERD Nephrolithiasis, S/P procedures, rx K citrate ~ 03-2016 Rosacea Shingles, L chest 2018 Plane xanthelasma  PLAN: HTN: On irbersartan and potassium, BP slightly elevated today but is consistently normal at home.  Recheck: 138/88 , no change GERD: On Nexium every other day, no symptoms, recommend to wean off and use it as needed only.  Avoid GERD triggering foods. Knee pain: Exam is normal, call if it becomes a persistent problem. RTC 1 year

## 2019-05-10 NOTE — Progress Notes (Signed)
Pre visit review using our clinic review tool, if applicable. No additional management support is needed unless otherwise documented below in the visit note. 

## 2019-05-10 NOTE — Assessment & Plan Note (Signed)
HTN: On irbersartan and potassium, BP slightly elevated today but is consistently normal at home.  Recheck: 138/88 , no change GERD: On Nexium every other day, no symptoms, recommend to wean off and use it as needed only.  Avoid GERD triggering foods. Knee pain: Exam is normal, call if it becomes a persistent problem. RTC 1 year

## 2019-07-16 ENCOUNTER — Other Ambulatory Visit: Payer: Self-pay | Admitting: Internal Medicine

## 2019-09-21 ENCOUNTER — Encounter: Payer: Self-pay | Admitting: Internal Medicine

## 2020-05-15 ENCOUNTER — Other Ambulatory Visit: Payer: Self-pay

## 2020-05-15 ENCOUNTER — Encounter: Payer: Self-pay | Admitting: Internal Medicine

## 2020-05-15 ENCOUNTER — Ambulatory Visit (INDEPENDENT_AMBULATORY_CARE_PROVIDER_SITE_OTHER): Payer: BC Managed Care – PPO | Admitting: Internal Medicine

## 2020-05-15 VITALS — BP 144/87 | HR 63 | Temp 97.8°F | Resp 18 | Ht 72.0 in | Wt 180.4 lb

## 2020-05-15 DIAGNOSIS — Z0000 Encounter for general adult medical examination without abnormal findings: Secondary | ICD-10-CM | POA: Diagnosis not present

## 2020-05-15 DIAGNOSIS — I10 Essential (primary) hypertension: Secondary | ICD-10-CM | POA: Diagnosis not present

## 2020-05-15 DIAGNOSIS — Z1159 Encounter for screening for other viral diseases: Secondary | ICD-10-CM

## 2020-05-15 DIAGNOSIS — Z23 Encounter for immunization: Secondary | ICD-10-CM | POA: Diagnosis not present

## 2020-05-15 MED ORDER — POTASSIUM CITRATE ER 15 MEQ (1620 MG) PO TBCR: 1.0000 | EXTENDED_RELEASE_TABLET | Freq: Two times a day (BID) | ORAL | 3 refills | Status: DC

## 2020-05-15 NOTE — Progress Notes (Signed)
Subjective:    Patient ID: Willie Jo., male    DOB: 18-Mar-1981, 39 y.o.   MRN: [id number]  DOS:  05/15/2020 Type of visit - description: CPX No major concerns.  Feeling well.  Wt Readings from Last 3 Encounters:  05/15/20 180 lb 6 oz (81.8 kg)  05/10/19 186 lb 4 oz (84.5 kg)  05/04/18 185 lb (83.9 kg)      Review of Systems Has been taking PPIs as needed, occasional dysphagia.  See below.  Other than above, a 14 point review of systems is negative     Past Medical History:  Diagnosis Date  . GERD (gastroesophageal reflux disease)   . History of kidney stones   . HTN (hypertension) 03/13/2013  . Nephrolithiasis    right non-obstructive per ct 12-30-2015  . Right ureteral stone   . Rosacea    symptoms increase w/ beer which he avoicds, dx @ derm  . Wears contact lenses     Past Surgical History:  Procedure Laterality Date  . CYSTOSCOPY WITH RETROGRADE PYELOGRAM, URETEROSCOPY AND STENT PLACEMENT Left 02/27/2013   Procedure: CYSTOSCOPY WITH RETROGRADE PYELOGRAM, URETEROSCOPY AND STENT PLACEMENT;  Surgeon: Sebastian Ache, MD;  Location: [facility];  Service: Urology;  Laterality: Left;   NEED DIGITAL URETERSCOPE   . CYSTOSCOPY WITH RETROGRADE PYELOGRAM, URETEROSCOPY AND STENT PLACEMENT Right 01/01/2016   Procedure: CYSTOSCOPY WITH RIGHT RETROGRADE PYELOGRAM, URETEROSCOPY AND STENT PLACEMENT;  Surgeon: Sebastian Ache, MD;  Location: [facility];  Service: Urology;  Laterality: Right;  . HOLMIUM LASER APPLICATION Left 02/27/2013   Procedure: HOLMIUM LASER APPLICATION;  Surgeon: Sebastian Ache, MD;  Location: [facility];  Service: Urology;  Laterality: Left;  . HOLMIUM LASER APPLICATION Right 01/01/2016   Procedure: HOLMIUM LASER APPLICATION;  Surgeon: Sebastian Ache, MD;  Location: [facility];  Service: Urology;  Laterality: Right;  . LITHOTRIPSY  01-30-2013    Allergies as of 05/15/2020   No Known Allergies     Medication List        Accurate as of May 15, 2020  9:12 PM. If you have any questions, ask your nurse or doctor.        STOP taking these medications   losartan 100 MG tablet Commonly known as: COZAAR Stopped by: Willow Ora, MD     TAKE these medications   esomeprazole 40 MG capsule Commonly known as: NEXIUM Take 40 mg by mouth every other day.   irbesartan 300 MG tablet Commonly known as: AVAPRO Take 1 tablet (300 mg total) by mouth daily.   Potassium Citrate 15 MEQ (1620 MG) Tbcr Take 1 tablet by mouth 2 (two) times daily.          Objective:   Physical Exam BP (!) 144/87 (BP Location: Left Arm, Patient Position: Sitting, Cuff Size: Small)   Pulse 63   Temp 97.8 F (36.6 C) (Oral)   Resp 18   Ht 6' (1.829 m)   Wt 180 lb 6 oz (81.8 kg)   SpO2 100%   BMI 24.46 kg/m  General: Well developed, NAD, BMI noted Neck: No  thyromegaly  HEENT:  Normocephalic . Face symmetric, atraumatic Lungs:  CTA B Normal respiratory effort, no intercostal retractions, no accessory muscle use. Heart: RRR,  no murmur.  Abdomen:  Not distended, soft, non-tender. No rebound or rigidity.   Lower extremities: no pretibial edema bilaterally  Skin: Exposed areas without rash. Not pale. Not jaundice Neurologic:  alert & oriented X3.  Speech normal,  gait appropriate for age and unassisted Strength symmetric and appropriate for age.  Psych: Cognition and judgment appear intact.  Cooperative with normal attention span and concentration.  Behavior appropriate. No anxious or depressed appearing.     Assessment     Assessment HTN GERD Nephrolithiasis, S/P procedures, rx K citrate ~ 03-2016 Rosacea Shingles, L chest 2018 Plane xanthelasma  PLAN: Here for CPX HTN: On a Avapro, BP today slightly elevated today, at home consistently 120/84, 115/76.  No change, labs Nephrolithiasis: On potassium citrate, was advised by urology to follow-up prn and asked pcp to RF K citrate:  Will do. GERD: Has been able  to wean off PPIs successfully on and off.  Typically when he is off PPI for a while has mild dysphagia w/  Solids, then he goes back to PPIs for few weeks and is back to normal.  Recommend to let me know if dysphagia is persistent or severe or if he has odynophagia. Continue PPIs as needed RTC 1 year   This visit occurred during the SARS-CoV-2 public health emergency.  Safety protocols were in place, including screening questions prior to the visit, additional usage of staff PPE, and extensive cleaning of exam room while observing appropriate contact time as indicated for disinfecting solutions.

## 2020-05-15 NOTE — Progress Notes (Signed)
Pre visit review using our clinic review tool, if applicable. No additional management support is needed unless otherwise documented below in the visit note. 

## 2020-05-15 NOTE — Assessment & Plan Note (Signed)
Here for CPX HTN: On a Avapro, BP today slightly elevated today, at home consistently 120/84, 115/76.  No change, labs Nephrolithiasis: On potassium citrate, was advised by urology to follow-up prn and asked pcp to RF K citrate:  Will do. GERD: Has been able to wean off PPIs successfully on and off.  Typically when he is off PPI for a while has mild dysphagia w/  Solids, then he goes back to PPIs for few weeks and is back to normal.  Recommend to let me know if dysphagia is persistent or severe or if he has odynophagia. Continue PPIs as needed RTC 1 year

## 2020-05-15 NOTE — Assessment & Plan Note (Signed)
-  Tdap 04/2019 - S/P C-19 pfizer, last dose 10/2019.  Plans to get a booster -Flu shot today -Labs: CMP, FLP, CBC, hep C -Diet and exercise: Improved, has lost some weight.

## 2020-05-15 NOTE — Patient Instructions (Signed)
Check the  blood pressure regularly °BP GOAL is between 110/65 and  135/85. °If it is consistently higher or lower, let me know °  ° °GO TO THE LAB : Get the blood work   ° ° °GO TO THE FRONT DESK, PLEASE SCHEDULE YOUR APPOINTMENTS °Come back for   a physical exam in 1 year °

## 2020-05-16 LAB — COMPREHENSIVE METABOLIC PANEL
AG Ratio: 1.4 (calc) (ref 1.0–2.5)
ALT: 14 U/L (ref 9–46)
AST: 15 U/L (ref 10–40)
Albumin: 4.3 g/dL (ref 3.6–5.1)
Alkaline phosphatase (APISO): 51 U/L (ref 36–130)
BUN: 12 mg/dL (ref 7–25)
CO2: 26 mmol/L (ref 20–32)
Calcium: 9.9 mg/dL (ref 8.6–10.3)
Chloride: 104 mmol/L (ref 98–110)
Creat: 1.05 mg/dL (ref 0.60–1.35)
Globulin: 3.1 g/dL (calc) (ref 1.9–3.7)
Glucose, Bld: 98 mg/dL (ref 65–99)
Potassium: 5.3 mmol/L (ref 3.5–5.3)
Sodium: 140 mmol/L (ref 135–146)
Total Bilirubin: 0.8 mg/dL (ref 0.2–1.2)
Total Protein: 7.4 g/dL (ref 6.1–8.1)

## 2020-05-16 LAB — CBC WITH DIFFERENTIAL/PLATELET
Absolute Monocytes: 402 cells/uL (ref 200–950)
Basophils Absolute: 29 cells/uL (ref 0–200)
Basophils Relative: 0.6 %
Eosinophils Absolute: 201 cells/uL (ref 15–500)
Eosinophils Relative: 4.1 %
HCT: 47.3 % (ref 38.5–50.0)
Hemoglobin: 16.1 g/dL (ref 13.2–17.1)
Lymphs Abs: 1294 cells/uL (ref 850–3900)
MCH: 30.5 pg (ref 27.0–33.0)
MCHC: 34 g/dL (ref 32.0–36.0)
MCV: 89.6 fL (ref 80.0–100.0)
MPV: 11.2 fL (ref 7.5–12.5)
Monocytes Relative: 8.2 %
Neutro Abs: 2974 cells/uL (ref 1500–7800)
Neutrophils Relative %: 60.7 %
Platelets: 205 10*3/uL (ref 140–400)
RBC: 5.28 10*6/uL (ref 4.20–5.80)
RDW: 12.9 % (ref 11.0–15.0)
Total Lymphocyte: 26.4 %
WBC: 4.9 10*3/uL (ref 3.8–10.8)

## 2020-05-16 LAB — LIPID PANEL
Cholesterol: 183 mg/dL (ref <200)
HDL: 39 mg/dL — ABNORMAL LOW (ref >=40)
LDL Cholesterol (Calc): 118 mg/dL (calc) — ABNORMAL HIGH
Non-HDL Cholesterol (Calc): 144 mg/dL (calc) — ABNORMAL HIGH (ref <130)
Total CHOL/HDL Ratio: 4.7 (calc) (ref <5.0)
Triglycerides: 145 mg/dL (ref <150)

## 2020-05-16 LAB — HEPATITIS C ANTIBODY
Hepatitis C Ab: NONREACTIVE
SIGNAL TO CUT-OFF: 0.01 (ref <1.00)

## 2020-06-28 ENCOUNTER — Other Ambulatory Visit: Payer: Self-pay | Admitting: Internal Medicine

## 2020-12-16 ENCOUNTER — Telehealth: Payer: BC Managed Care – PPO | Admitting: Emergency Medicine

## 2020-12-16 DIAGNOSIS — R059 Cough, unspecified: Secondary | ICD-10-CM

## 2020-12-16 MED ORDER — BENZONATATE 100 MG PO CAPS: 100.0000 mg | ORAL_CAPSULE | Freq: Two times a day (BID) | ORAL | 0 refills | Status: DC | PRN

## 2020-12-16 NOTE — Progress Notes (Signed)
We are sorry that you are not feeling well.  Here is how we plan to help!  If you develop a fever, or if your cough becomes productive, let us know.  You might need to start an antibiotic if either of those things happen.   For now, with your cough being dry and fever free, I recommend the following.  Based on your presentation I believe you most likely have A cough due to a virus.  This is called viral bronchitis and is best treated by rest, plenty of fluids and control of the cough.  You may use Ibuprofen or Tylenol as directed to help your symptoms.     In addition you may use A prescription cough medication called Tessalon Perles 100mg . You may take 1-2 capsules every 8 hours as needed for your cough.   From your responses in the eVisit questionnaire you describe inflammation in the upper respiratory tract which is causing a significant cough.  This is commonly called Bronchitis and has four common causes:    Allergies  Viral Infections  Acid Reflux  Bacterial Infection Allergies, viruses and acid reflux are treated by controlling symptoms or eliminating the cause. An example might be a cough caused by taking certain blood pressure medications. You stop the cough by changing the medication. Another example might be a cough caused by acid reflux. Controlling the reflux helps control the cough.  USE OF BRONCHODILATOR ("RESCUE") INHALERS: There is a risk from using your bronchodilator too frequently.  The risk is that over-reliance on a medication which only relaxes the muscles surrounding the breathing tubes can reduce the effectiveness of medications prescribed to reduce swelling and congestion of the tubes themselves.  Although you feel brief relief from the bronchodilator inhaler, your asthma may actually be worsening with the tubes becoming more swollen and filled with mucus.  This can delay other crucial treatments, such as oral steroid medications. If you need to use a bronchodilator  inhaler daily, several times per day, you should discuss this with your provider.  There are probably better treatments that could be used to keep your asthma under control.     HOME CARE . Only take medications as instructed by your medical team. . Complete the entire course of an antibiotic. . Drink plenty of fluids and get plenty of rest. . Avoid close contacts especially the very young and the elderly . Cover your mouth if you cough or cough into your sleeve. . Always remember to wash your hands . A steam or ultrasonic humidifier can help congestion.   GET HELP RIGHT AWAY IF: . You develop worsening fever. . You become short of breath . You cough up blood. . Your symptoms persist after you have completed your treatment plan MAKE SURE YOU   Understand these instructions.  Will watch your condition.  Will get help right away if you are not doing well or get worse.  Your e-visit answers were reviewed by a board certified advanced clinical practitioner to complete your personal care plan.  Depending on the condition, your plan could have included both over the counter or prescription medications. If there is a problem please reply  once you have received a response from your provider. Your safety is important to .  If you have drug allergies check your prescription carefully.    You can use MyChart to ask questions about today's visit, request a non-urgent call back, or ask for a work or school excuse for 24 hours related  to this e-Visit. If it has been greater than 24 hours you will need to follow up with your provider, or enter a new e-Visit to address those concerns. You will get an e-mail in the next two days asking about your experience.  I hope that your e-visit has been valuable and will speed your recovery. Thank you for using e-visits.  Approximately 5 minutes was used in reviewing the patient's chart, questionnaire, prescribing medications, and documentation.

## 2021-03-23 ENCOUNTER — Telehealth: Payer: BC Managed Care – PPO | Admitting: Family

## 2021-03-23 DIAGNOSIS — L259 Unspecified contact dermatitis, unspecified cause: Secondary | ICD-10-CM

## 2021-03-23 MED ORDER — TRIAMCINOLONE ACETONIDE 0.5 % EX OINT: 1.0000 "application " | TOPICAL_OINTMENT | Freq: Two times a day (BID) | CUTANEOUS | 0 refills | Status: DC

## 2021-03-23 NOTE — Progress Notes (Signed)
E Visit for Rash  We are sorry that you are not feeling well. Here is how we plan to help!  Based on what you shared with me it looks like you have contact dermatitis.  Contact dermatitis is a skin rash caused by something that touches the skin and causes irritation or inflammation.  Your skin may be red, swollen, dry, cracked, and itch.  The rash should go away in a few days but can last a few weeks.  If you get a rash, it's important to figure out what caused it so the irritant can be avoided in the future.  I have sent in kenalog cream twice a day.    HOME CARE:  Take cool showers and avoid direct sunlight. Apply cool compress or wet dressings. Take a bath in an oatmeal bath.  Sprinkle content of one Aveeno packet under running faucet with comfortably warm water.  Bathe for 15-20 minutes, 1-2 times daily.  Pat dry with a towel. Do not rub the rash. Use hydrocortisone cream. Take an antihistamine like Benadryl for widespread rashes that itch.  The adult dose of Benadryl is 25-50 mg by mouth 4 times daily. Caution:  This type of medication may cause sleepiness.  Do not drink alcohol, drive, or operate dangerous machinery while taking antihistamines.  Do not take these medications if you have prostate enlargement.  Read package instructions thoroughly on all medications that you take.  GET HELP RIGHT AWAY IF:  Symptoms don't go away after treatment. Severe itching that persists. If you rash spreads or swells. If you rash begins to smell. If it blisters and opens or develops a yellow-brown crust. You develop a fever. You have a sore throat. You become short of breath.  MAKE SURE YOU:  Understand these instructions. Will watch your condition. Will get help right away if you are not doing well or get worse.  Thank you for choosing an e-visit.  Your e-visit answers were reviewed by a board certified advanced clinical practitioner to complete your personal care plan. Depending upon  the condition, your plan could have included both over the counter or prescription medications.  Please review your pharmacy choice. Make sure the pharmacy is open so you can pick up prescription now. If there is a problem, you may contact your provider through [employer] and have the prescription routed to another pharmacy.  Your safety is important to [place]. If you have drug allergies check your prescription carefully.   For the next 24 hours you can use MyChart to ask questions about today's visit, request a non-urgent call back, or ask for a work or school excuse. You will get an email in the next two days asking about your experience. I hope that your e-visit has been valuable and will speed your recovery.   Approximately 5 minutes was spent documenting and reviewing patient's chart.

## 2021-03-28 ENCOUNTER — Encounter: Payer: Self-pay | Admitting: Internal Medicine

## 2021-03-28 ENCOUNTER — Ambulatory Visit: Payer: BC Managed Care – PPO | Admitting: Internal Medicine

## 2021-03-28 ENCOUNTER — Other Ambulatory Visit: Payer: Self-pay

## 2021-03-28 VITALS — BP 126/84 | HR 82 | Temp 98.2°F | Resp 16 | Ht 72.0 in | Wt 177.5 lb

## 2021-03-28 DIAGNOSIS — R197 Diarrhea, unspecified: Secondary | ICD-10-CM

## 2021-03-28 NOTE — Progress Notes (Signed)
Subjective:    Patient ID: Willie Jo., male    DOB: Apr 07, 1981, 40 y.o.   MRN: [id number]  DOS:  03/28/2021 Type of visit - description: Acute  Took a trip the last week of July, got slightly constipated which is not unusual for him. Went to [place] the first week of August, came back home 03/18/2021, that night they ate [place]. He ate [place] leftover 3 days later. The next day he had explosive diarrhea with foul-smelling stools. Since then had mild upper abdominal pain and some gurgling sounds. Had another episode of diarrhea 2 days ago and now is steadily getting better.  No fever chills No other family members affected No recent NSAID No nausea or vomiting No GERD He sees from time to time red blood in the toilet paper with BMs, nothing unusual   Review of Systems See above   Past Medical History:  Diagnosis Date   GERD (gastroesophageal reflux disease)    History of kidney stones    HTN (hypertension) 03/13/2013   Nephrolithiasis    right non-obstructive per ct 12-30-2015   Right ureteral stone    Rosacea    symptoms increase w/ beer which he avoicds, dx @ derm   Wears contact lenses     Past Surgical History:  Procedure Laterality Date   CYSTOSCOPY WITH RETROGRADE PYELOGRAM, URETEROSCOPY AND STENT PLACEMENT Left 02/27/2013   Procedure: CYSTOSCOPY WITH RETROGRADE PYELOGRAM, URETEROSCOPY AND STENT PLACEMENT;  Surgeon: Sebastian Ache, MD;  Location: [facility];  Service: Urology;  Laterality: Left;   NEED DIGITAL URETERSCOPE    CYSTOSCOPY WITH RETROGRADE PYELOGRAM, URETEROSCOPY AND STENT PLACEMENT Right 01/01/2016   Procedure: CYSTOSCOPY WITH RIGHT RETROGRADE PYELOGRAM, URETEROSCOPY AND STENT PLACEMENT;  Surgeon: Sebastian Ache, MD;  Location: [facility];  Service: Urology;  Laterality: Right;   HOLMIUM LASER APPLICATION Left 02/27/2013   Procedure: HOLMIUM LASER APPLICATION;  Surgeon: Sebastian Ache, MD;  Location: [facility];  Service: Urology;   Laterality: Left;   HOLMIUM LASER APPLICATION Right 01/01/2016   Procedure: HOLMIUM LASER APPLICATION;  Surgeon: Sebastian Ache, MD;  Location: [facility];  Service: Urology;  Laterality: Right;   LITHOTRIPSY  01-30-2013    Allergies as of 03/28/2021   No Known Allergies      Medication List        Accurate as of March 28, 2021 10:52 AM. If you have any questions, ask your nurse or doctor.          benzonatate 100 MG capsule Commonly known as: TESSALON Take 1 capsule (100 mg total) by mouth 2 (two) times daily as needed for cough.   esomeprazole 40 MG capsule Commonly known as: NEXIUM Take 40 mg by mouth every other day.   irbesartan 300 MG tablet Commonly known as: AVAPRO Take 1 tablet (300 mg total) by mouth daily.   Potassium Citrate 15 MEQ (1620 MG) Tbcr Take 1 tablet by mouth 2 (two) times daily.   triamcinolone ointment 0.5 % Commonly known as: KENALOG Apply 1 application topically 2 (two) times daily.           Objective:   Physical Exam BP 126/84 (BP Location: Left Arm, Patient Position: Sitting, Cuff Size: Small)   Pulse 82   Temp 98.2 F (36.8 C) (Oral)   Resp 16   Ht 6' (1.829 m)   Wt 177 lb 8 oz (80.5 kg)   SpO2 97%   BMI 24.07 kg/m  General:   Well developed,  NAD, BMI noted.  HEENT:  Normocephalic . Face symmetric, atraumatic.  No pale no jaundice Lungs:  CTA B Normal respiratory effort, no intercostal retractions, no accessory muscle use. Heart: RRR,  no murmur.  Abdomen:  Not distended, soft, non-tender. No rebound or rigidity.   Skin: Not pale. Not jaundice Lower extremities: no pretibial edema bilaterally  Neurologic:  alert & oriented X3.  Speech normal, gait appropriate for age and unassisted Psych--  Cognition and judgment appear intact.  Cooperative with normal attention span and concentration.  Behavior appropriate. No anxious or depressed appearing.     Assessment      Assessment HTN GERD Nephrolithiasis, S/P procedures, rx K citrate ~ 03-2016 Rosacea Shingles, L chest 2018 Plane xanthelasma  PLAN: Acute diarrhea: Symptoms as described above, suspect diarrhea was food related.  Could be viral as well, I have seen other people in the community with acute diarrhea. At this point he is asymptomatic for 48 hours, afebrile, abdominal exam is benign. We agreed on essentially observation, good hydration, Pepto-Bismol, definitely call if symptoms resurface.  This visit occurred during the SARS-CoV-2 public health emergency.  Safety protocols were in place, including screening questions prior to the visit, additional usage of staff PPE, and extensive cleaning of exam room while observing appropriate contact time as indicated for disinfecting solutions.

## 2021-03-29 ENCOUNTER — Encounter: Payer: Self-pay | Admitting: Internal Medicine

## 2021-03-29 NOTE — Assessment & Plan Note (Signed)
Acute diarrhea: Symptoms as described above, suspect diarrhea was food related.  Could be viral as well, I have seen other people in the community with acute diarrhea. At this point he is asymptomatic for 48 hours, afebrile, abdominal exam is benign. We agreed on essentially observation, good hydration, Pepto-Bismol, definitely call if symptoms resurface.

## 2021-05-20 ENCOUNTER — Ambulatory Visit (INDEPENDENT_AMBULATORY_CARE_PROVIDER_SITE_OTHER): Payer: BC Managed Care – PPO | Admitting: Internal Medicine

## 2021-05-20 ENCOUNTER — Other Ambulatory Visit: Payer: Self-pay

## 2021-05-20 ENCOUNTER — Encounter: Payer: Self-pay | Admitting: Internal Medicine

## 2021-05-20 VITALS — BP 128/82 | HR 60 | Temp 98.1°F | Resp 16 | Ht 72.0 in | Wt 177.2 lb

## 2021-05-20 DIAGNOSIS — Z23 Encounter for immunization: Secondary | ICD-10-CM

## 2021-05-20 DIAGNOSIS — I10 Essential (primary) hypertension: Secondary | ICD-10-CM | POA: Diagnosis not present

## 2021-05-20 DIAGNOSIS — Z0000 Encounter for general adult medical examination without abnormal findings: Secondary | ICD-10-CM | POA: Diagnosis not present

## 2021-05-20 LAB — LIPID PANEL
Cholesterol: 180 mg/dL (ref 0–200)
HDL: 43.4 mg/dL (ref >39.00)
LDL Cholesterol: 107 mg/dL — ABNORMAL HIGH (ref 0–99)
NonHDL: 136.47
Total CHOL/HDL Ratio: 4
Triglycerides: 145 mg/dL (ref 0.0–149.0)
VLDL: 29 mg/dL (ref 0.0–40.0)

## 2021-05-20 LAB — CBC WITH DIFFERENTIAL/PLATELET
Basophils Absolute: 0 10*3/uL (ref 0.0–0.1)
Basophils Relative: 0.4 % (ref 0.0–3.0)
Eosinophils Absolute: 0.2 10*3/uL (ref 0.0–0.7)
Eosinophils Relative: 3.2 % (ref 0.0–5.0)
HCT: 46.4 % (ref 39.0–52.0)
Hemoglobin: 15.5 g/dL (ref 13.0–17.0)
Lymphocytes Relative: 27.4 % (ref 12.0–46.0)
Lymphs Abs: 1.6 10*3/uL (ref 0.7–4.0)
MCHC: 33.4 g/dL (ref 30.0–36.0)
MCV: 89.4 fl (ref 78.0–100.0)
Monocytes Absolute: 0.4 10*3/uL (ref 0.1–1.0)
Monocytes Relative: 6.8 % (ref 3.0–12.0)
Neutro Abs: 3.7 10*3/uL (ref 1.4–7.7)
Neutrophils Relative %: 62.2 % (ref 43.0–77.0)
Platelets: 186 10*3/uL (ref 150.0–400.0)
RBC: 5.18 Mil/uL (ref 4.22–5.81)
RDW: 13.2 % (ref 11.5–15.5)
WBC: 6 10*3/uL (ref 4.0–10.5)

## 2021-05-20 LAB — COMPREHENSIVE METABOLIC PANEL
ALT: 13 U/L (ref 0–53)
AST: 15 U/L (ref 0–37)
Albumin: 4.2 g/dL (ref 3.5–5.2)
Alkaline Phosphatase: 44 U/L (ref 39–117)
BUN: 14 mg/dL (ref 6–23)
CO2: 27 mEq/L (ref 19–32)
Calcium: 9.2 mg/dL (ref 8.4–10.5)
Chloride: 104 mEq/L (ref 96–112)
Creatinine, Ser: 1.01 mg/dL (ref 0.40–1.50)
GFR: 91.93 mL/min (ref >60.00)
Glucose, Bld: 91 mg/dL (ref 70–99)
Potassium: 4.6 mEq/L (ref 3.5–5.1)
Sodium: 138 mEq/L (ref 135–145)
Total Bilirubin: 0.8 mg/dL (ref 0.2–1.2)
Total Protein: 7.3 g/dL (ref 6.0–8.3)

## 2021-05-20 LAB — TSH: TSH: 1.58 u[IU]/mL (ref 0.35–5.50)

## 2021-05-20 MED ORDER — IRBESARTAN 300 MG PO TABS: 300.0000 mg | ORAL_TABLET | Freq: Every day | ORAL | 3 refills | Status: DC

## 2021-05-20 MED ORDER — POTASSIUM CITRATE ER 15 MEQ (1620 MG) PO TBCR: 1.0000 | EXTENDED_RELEASE_TABLET | Freq: Two times a day (BID) | ORAL | 3 refills | Status: DC

## 2021-05-20 NOTE — Progress Notes (Signed)
Subjective:    Patient ID: Willie Jo., male    DOB: 09-17-1980, 40 y.o.   MRN: [id number]  DOS:  05/20/2021 Type of visit - description: CPX  Since the last office visit is doing well. Had diarrhea, that resolved, subsequently had some abdominal discomfort for few days but that also resolved. Denies any nausea, vomiting, change in the color of the stools. Still has occasional heartburn.  Wt Readings from Last 3 Encounters:  05/20/21 177 lb 4 oz (80.4 kg)  03/28/21 177 lb 8 oz (80.5 kg)  05/15/20 180 lb 6 oz (81.8 kg)     Review of Systems  Other than above, a 14 point review of systems is negative      Past Medical History:  Diagnosis Date   GERD (gastroesophageal reflux disease)    History of kidney stones    HTN (hypertension) 03/13/2013   Nephrolithiasis    right non-obstructive per ct 12-30-2015   Right ureteral stone    Rosacea    symptoms increase w/ beer which he avoicds, dx @ derm   Wears contact lenses     Past Surgical History:  Procedure Laterality Date   CYSTOSCOPY WITH RETROGRADE PYELOGRAM, URETEROSCOPY AND STENT PLACEMENT Left 02/27/2013   Procedure: CYSTOSCOPY WITH RETROGRADE PYELOGRAM, URETEROSCOPY AND STENT PLACEMENT;  Surgeon: Sebastian Ache, MD;  Location: [facility];  Service: Urology;  Laterality: Left;   NEED DIGITAL URETERSCOPE    CYSTOSCOPY WITH RETROGRADE PYELOGRAM, URETEROSCOPY AND STENT PLACEMENT Right 01/01/2016   Procedure: CYSTOSCOPY WITH RIGHT RETROGRADE PYELOGRAM, URETEROSCOPY AND STENT PLACEMENT;  Surgeon: Sebastian Ache, MD;  Location: [facility];  Service: Urology;  Laterality: Right;   HOLMIUM LASER APPLICATION Left 02/27/2013   Procedure: HOLMIUM LASER APPLICATION;  Surgeon: Sebastian Ache, MD;  Location: [facility];  Service: Urology;  Laterality: Left;   HOLMIUM LASER APPLICATION Right 01/01/2016   Procedure: HOLMIUM LASER APPLICATION;  Surgeon: Sebastian Ache, MD;  Location: [facility];  Service:  Urology;  Laterality: Right;   LITHOTRIPSY  01-30-2013   Social History   Socioeconomic History   Marital status: Married    Spouse name: Not on file   Number of children: 1   Years of education: Not on file   Highest education level: Not on file  Occupational History   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  Tobacco Use   Smoking status: Never   Smokeless tobacco: Never  Substance and Sexual Activity   Alcohol use: Yes    Alcohol/week: 1.0 standard drink    Types: 1 Glasses of wine per week    Comment: socially    Drug use: No   Sexual activity: Yes  Other Topics Concern   Not on file  Social History Narrative   Household-- pt, wife, 2013 daughter    Social Determinants of Health   Financial Resource Strain: Not on file  Food Insecurity: Not on file  Transportation Needs: Not on file  Physical Activity: Not on file  Stress: Not on file  Social Connections: Not on file  Intimate Partner Violence: Not on file    Allergies as of 05/20/2021   No Known Allergies      Medication List        Accurate as of May 20, 2021  3:58 PM. If you have any questions, ask your nurse or doctor.          STOP taking these medications    triamcinolone ointment 0.5 % Commonly known  as: KENALOG Stopped by: Willow Ora, MD       TAKE these medications    irbesartan 300 MG tablet Commonly known as: AVAPRO Take 1 tablet (300 mg total) by mouth daily.   Potassium Citrate 15 MEQ (1620 MG) Tbcr Take 1 tablet by mouth 2 (two) times daily.           Objective:   Physical Exam BP 128/82 (BP Location: Left Arm, Patient Position: Sitting, Cuff Size: Small)   Pulse 60   Temp 98.1 F (36.7 C) (Oral)   Resp 16   Ht 6' (1.829 m)   Wt 177 lb 4 oz (80.4 kg)   SpO2 98%   BMI 24.04 kg/m  General: Well developed, NAD, BMI noted Neck: No  thyromegaly  HEENT:  Normocephalic . Face symmetric, atraumatic Lungs:  CTA B Normal respiratory effort, no  intercostal retractions, no accessory muscle use. Heart: RRR,  no murmur.  Abdomen:  Not distended, soft, non-tender. No rebound or rigidity.   Lower extremities: no pretibial edema bilaterally  Skin: Exposed areas without rash. Not pale. Not jaundice Neurologic:  alert & oriented X3.  Speech normal, gait appropriate for age and unassisted Strength symmetric and appropriate for age.  Psych: Cognition and judgment appear intact.  Cooperative with normal attention span and concentration.  Behavior appropriate. No anxious or depressed appearing.     Assessment    Assessment HTN GERD Nephrolithiasis, S/P procedures, rx K citrate ~ 03-2016 Rosacea Shingles, L chest 2018  xanthelasma  PLAN: Here for CPX HTN: On it irbesartan, BP is good, no change, check ambulatory BPs GERD: Very sporadic symptoms, we talked about diet to prevent symptoms the use of OTCs as needed.  To call if symptoms increase. RTC 1 year     This visit occurred during the SARS-CoV-2 public health emergency.  Safety protocols were in place, including screening questions prior to the visit, additional usage of staff PPE, and extensive cleaning of exam room while observing appropriate contact time as indicated for disinfecting solutions.

## 2021-05-20 NOTE — Assessment & Plan Note (Signed)
-  Tdap 04/2019 - S/P C-19 pfizer, x4, last was the bivalent  -Flu shot today -Labs: CMP, FLP, CBC, TSH -Diet and exercise: He remains active, diet is okay most of the time.

## 2021-05-20 NOTE — Assessment & Plan Note (Signed)
Here for CPX HTN: On it irbesartan, BP is good, no change, check ambulatory BPs GERD: Very sporadic symptoms, we talked about diet to prevent symptoms the use of OTCs as needed.  To call if symptoms increase. RTC 1 year

## 2021-05-20 NOTE — Patient Instructions (Addendum)
Check the  blood pressure regulalrly BP GOAL is between 110/65 and  135/85. If it is consistently higher or lower, let me know    GO TO THE LAB : Get the blood work     GO TO THE FRONT DESK, PLEASE SCHEDULE YOUR APPOINTMENTS Come back for  a physical exam in 1 year     Food Choices for Gastroesophageal Reflux Disease, Adult When you have gastroesophageal reflux disease (GERD), the foods you eat and your eating habits are very important. Choosing the right foods can help ease your discomfort. Think about working with a food expert (dietitian) to help you make good choices. What are tips for following this plan? Reading food labels Look for foods that are low in saturated fat. Foods that may help with your symptoms include: Foods that have less than 5% of daily value (DV) of fat. Foods that have 0 grams of trans fat. Cooking Do not fry your food. Cook your food by baking, steaming, grilling, or broiling. These are all methods that do not need a lot of fat for cooking. To add flavor, try to use herbs that are low in spice and acidity. Meal planning  Choose healthy foods that are low in fat, such as: Fruits and vegetables. Whole grains. Low-fat dairy products. Lean meats, fish, and poultry. Eat small meals often instead of eating 3 large meals each day. Eat your meals slowly in a place where you are relaxed. Avoid bending over or lying down until 2-3 hours after eating. Limit high-fat foods such as fatty meats or fried foods. Limit your intake of fatty foods, such as oils, butter, and shortening. Avoid the following as told by your doctor: Foods that cause symptoms. These may be different for different people. Keep a food diary to keep track of foods that cause symptoms. Alcohol. Drinking a lot of liquid with meals. Eating meals during the 2-3 hours before bed. Lifestyle Stay at a healthy weight. Ask your doctor what weight is healthy for you. If you need to lose weight, work  with your doctor to do so safely. Exercise for at least 30 minutes on 5 or more days each week, or as told by your doctor. Wear loose-fitting clothes. Do not smoke or use any products that contain nicotine or tobacco. If you need help quitting, ask your doctor. Sleep with the head of your bed higher than your feet. Use a wedge under the mattress or blocks under the bed frame to raise the head of the bed. Chew sugar-free gum after meals. What foods should eat? Eat a healthy, well-balanced diet of fruits, vegetables, whole grains, low-fat dairy products, lean meats, fish, and poultry. Each person is different. Foods that may cause symptoms in one person may not cause any symptoms in another person. Work with your doctor to find foods that are safe for you. The items listed above may not be a complete list of what you can eat and drink. Contact a food expert for more options. What foods should I avoid? Limiting some of these foods may help in managing the symptoms of GERD. Everyone is different. Talk with a food expert or your doctor to help you find the exact foods to avoid, if any. Fruits Any fruits prepared with added fat. Any fruits that cause symptoms. For some people, this may include citrus fruits, such as oranges, grapefruit, pineapple, and lemons. Vegetables Deep-fried vegetables. [place] fries. Any vegetables prepared with added fat. Any vegetables that cause symptoms. For some  people, this may include tomatoes and tomato products, chili peppers, onions and garlic, and horseradish. Grains Pastries or quick breads with added fat. Meats and other proteins High-fat meats, such as fatty beef or pork, hot dogs, ribs, ham, sausage, salami, and bacon. Fried meat or protein, including fried fish and fried chicken. Nuts and nut butters, in large amounts. Dairy Whole milk and chocolate milk. Sour cream. Cream. Ice cream. Cream cheese. Milkshakes. Fats and oils Butter. Margarine. Shortening.  Ghee. Beverages Coffee and tea, with or without caffeine. Carbonated beverages. Sodas. Energy drinks. Fruit juice made with acidic fruits, such as orange or grapefruit. Tomato juice. Alcoholic drinks. Sweets and desserts Chocolate and cocoa. Donuts. Seasonings and condiments Pepper. Peppermint and spearmint. Added salt. Any condiments, herbs, or seasonings that cause symptoms. For some people, this may include curry, hot sauce, or vinegar-based salad dressings. The items listed above may not be a complete list of what you should not eat and drink. Contact a food expert for more options. Questions to ask your doctor Diet and lifestyle changes are often the first steps that are taken to manage symptoms of GERD. If diet and lifestyle changes do not help, talk with your doctor about taking medicines. Where to find more information International Foundation for Gastrointestinal Disorders: aboutgerd.org Summary When you have GERD, food and lifestyle choices are very important in easing your symptoms. Eat small meals often instead of 3 large meals a day. Eat your meals slowly and in a place where you are relaxed. Avoid bending over or lying down until 2-3 hours after eating. Limit high-fat foods such as fatty meats or fried foods. This information is not intended to replace advice given to you by your health care provider. Make sure you discuss any questions you have with your health care provider. Document Revised: 02/05/2020 Document Reviewed: 02/05/2020 Elsevier Patient Education  2022 [employer].

## 2021-10-07 LAB — HM COLONOSCOPY

## 2021-10-09 ENCOUNTER — Encounter: Payer: Self-pay | Admitting: Internal Medicine

## 2022-02-01 ENCOUNTER — Telehealth: Payer: BC Managed Care – PPO | Admitting: Family

## 2022-02-01 DIAGNOSIS — H938X2 Other specified disorders of left ear: Secondary | ICD-10-CM

## 2022-02-01 DIAGNOSIS — H6992 Unspecified Eustachian tube disorder, left ear: Secondary | ICD-10-CM

## 2022-02-01 MED ORDER — FLUTICASONE PROPIONATE 50 MCG/ACT NA SUSP: 2.0000 | Freq: Every day | NASAL | 6 refills | Status: DC

## 2022-02-01 MED ORDER — CETIRIZINE HCL 10 MG PO TABS: 10.0000 mg | ORAL_TABLET | Freq: Every day | ORAL | 1 refills | Status: DC

## 2022-05-07 ENCOUNTER — Other Ambulatory Visit: Payer: Self-pay | Admitting: Internal Medicine

## 2022-05-22 ENCOUNTER — Encounter: Payer: BC Managed Care – PPO | Admitting: Internal Medicine

## 2022-05-26 ENCOUNTER — Encounter: Payer: Self-pay | Admitting: Internal Medicine

## 2022-05-26 ENCOUNTER — Ambulatory Visit (INDEPENDENT_AMBULATORY_CARE_PROVIDER_SITE_OTHER): Payer: BC Managed Care – PPO | Admitting: Internal Medicine

## 2022-05-26 VITALS — BP 126/80 | HR 62 | Temp 98.1°F | Resp 16 | Ht 72.0 in | Wt 180.2 lb

## 2022-05-26 DIAGNOSIS — Z0000 Encounter for general adult medical examination without abnormal findings: Secondary | ICD-10-CM

## 2022-05-26 DIAGNOSIS — Z23 Encounter for immunization: Secondary | ICD-10-CM | POA: Diagnosis not present

## 2022-05-26 DIAGNOSIS — I10 Essential (primary) hypertension: Secondary | ICD-10-CM | POA: Diagnosis not present

## 2022-05-26 LAB — CBC WITH DIFFERENTIAL/PLATELET
Basophils Absolute: 0 10*3/uL (ref 0.0–0.1)
Basophils Relative: 0.4 % (ref 0.0–3.0)
Eosinophils Absolute: 0.3 10*3/uL (ref 0.0–0.7)
Eosinophils Relative: 4.4 % (ref 0.0–5.0)
HCT: 45.1 % (ref 39.0–52.0)
Hemoglobin: 15 g/dL (ref 13.0–17.0)
Lymphocytes Relative: 28.5 % (ref 12.0–46.0)
Lymphs Abs: 1.7 10*3/uL (ref 0.7–4.0)
MCHC: 33.3 g/dL (ref 30.0–36.0)
MCV: 90 fl (ref 78.0–100.0)
Monocytes Absolute: 0.4 10*3/uL (ref 0.1–1.0)
Monocytes Relative: 7.2 % (ref 3.0–12.0)
Neutro Abs: 3.5 10*3/uL (ref 1.4–7.7)
Neutrophils Relative %: 59.5 % (ref 43.0–77.0)
Platelets: 176 10*3/uL (ref 150.0–400.0)
RBC: 5.01 Mil/uL (ref 4.22–5.81)
RDW: 13.4 % (ref 11.5–15.5)
WBC: 5.9 10*3/uL (ref 4.0–10.5)

## 2022-05-26 LAB — COMPREHENSIVE METABOLIC PANEL
ALT: 14 U/L (ref 0–53)
AST: 14 U/L (ref 0–37)
Albumin: 4.3 g/dL (ref 3.5–5.2)
Alkaline Phosphatase: 49 U/L (ref 39–117)
BUN: 14 mg/dL (ref 6–23)
CO2: 28 mEq/L (ref 19–32)
Calcium: 9 mg/dL (ref 8.4–10.5)
Chloride: 105 mEq/L (ref 96–112)
Creatinine, Ser: 0.86 mg/dL (ref 0.40–1.50)
GFR: 106.27 mL/min (ref >60.00)
Glucose, Bld: 87 mg/dL (ref 70–99)
Potassium: 4.4 mEq/L (ref 3.5–5.1)
Sodium: 140 mEq/L (ref 135–145)
Total Bilirubin: 0.9 mg/dL (ref 0.2–1.2)
Total Protein: 7 g/dL (ref 6.0–8.3)

## 2022-05-26 LAB — LIPID PANEL
Cholesterol: 185 mg/dL (ref 0–200)
HDL: 43.6 mg/dL (ref >39.00)
LDL Cholesterol: 120 mg/dL — ABNORMAL HIGH (ref 0–99)
NonHDL: 141.32
Total CHOL/HDL Ratio: 4
Triglycerides: 106 mg/dL (ref 0.0–149.0)
VLDL: 21.2 mg/dL (ref 0.0–40.0)

## 2022-05-26 NOTE — Assessment & Plan Note (Signed)
Here for CPX HTN: Good compliance with medications, ambulatory BPs normal, checking labs.  No change Nephrolithiasis: on K citrate, no symptoms. IBS: Saw GI with abdominal pain, clinical diagnosis with postviral IBS.  Rx dicyclomine.  Subsequently on 10/07/2021 had endoscopies: Colonoscopy: No polyps, normal. EGD: Erythema of the antrum and stomach, biopsy done.  Results not available to me Symptoms are largely resolved. RTC 1 year

## 2022-05-26 NOTE — Assessment & Plan Note (Signed)
-  Tdap 04/2019 -  C-19 vax up to date -CCS: Had a negative colonoscopy 09-2021 for GI symptoms that are largely now resolved -Flu shot today -Labs: CMP, FLP, CBC  -Diet and exercise: Doing well, he remains active, diet is mostly healthy.

## 2022-05-26 NOTE — Progress Notes (Signed)
Subjective:    Patient ID: Willie Jo., male    DOB: [birth year], 41 y.o.   MRN: [id number]  DOS:  05/26/2022 Type of visit - description: cpx  Since the last office visit is doing well. Did have GI symptoms, saw gastroenterology, had endoscopies.  Symptoms resolved.   Review of Systems  Other than above, a 14 point review of systems is negative     Past Medical History:  Diagnosis Date   GERD (gastroesophageal reflux disease)    History of kidney stones    HTN (hypertension) 03/13/2013   Nephrolithiasis    right non-obstructive per ct 12-30-2015   Right ureteral stone    Rosacea    symptoms increase w/ beer which he avoicds, dx @ derm   Wears contact lenses     Past Surgical History:  Procedure Laterality Date   CYSTOSCOPY WITH RETROGRADE PYELOGRAM, URETEROSCOPY AND STENT PLACEMENT Left 02/27/2013   Procedure: CYSTOSCOPY WITH RETROGRADE PYELOGRAM, URETEROSCOPY AND STENT PLACEMENT;  Surgeon: Sebastian Ache, MD;  Location: [facility];  Service: Urology;  Laterality: Left;   NEED DIGITAL URETERSCOPE    CYSTOSCOPY WITH RETROGRADE PYELOGRAM, URETEROSCOPY AND STENT PLACEMENT Right 01/01/2016   Procedure: CYSTOSCOPY WITH RIGHT RETROGRADE PYELOGRAM, URETEROSCOPY AND STENT PLACEMENT;  Surgeon: Sebastian Ache, MD;  Location: [facility];  Service: Urology;  Laterality: Right;   HOLMIUM LASER APPLICATION Left 02/27/2013   Procedure: HOLMIUM LASER APPLICATION;  Surgeon: Sebastian Ache, MD;  Location: [facility];  Service: Urology;  Laterality: Left;   HOLMIUM LASER APPLICATION Right 01/01/2016   Procedure: HOLMIUM LASER APPLICATION;  Surgeon: Sebastian Ache, MD;  Location: [facility];  Service: Urology;  Laterality: Right;   LITHOTRIPSY  01-30-2013   Social History   Socioeconomic History   Marital status: Married    Spouse name: Not on file   Number of children: 1   Years of education: Not on file   Highest education level: Not on file  Occupational  History   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  Tobacco Use   Smoking status: Never   Smokeless tobacco: Never  Substance and Sexual Activity   Alcohol use: Yes    Alcohol/week: 1.0 standard drink of alcohol    Types: 1 Glasses of wine per week    Comment: socially    Drug use: No   Sexual activity: Yes  Other Topics Concern   Not on file  Social History Narrative   Household-- pt, wife, 2013 daughter    Social Determinants of Corporate investment banker Strain: Not on file  Food Insecurity: Not on file  Transportation Needs: Not on file  Physical Activity: Not on file  Stress: Not on file  Social Connections: Not on file  Intimate Partner Violence: Not on file     Current Outpatient Medications  Medication Instructions   irbesartan (AVAPRO) 300 mg, Oral, Daily   Potassium Citrate 15 MEQ (1620 MG) TBCR 1 tablet, Oral, 2 times daily       Objective:   Physical Exam BP 126/80   Pulse 62   Temp 98.1 F (36.7 C) (Oral)   Resp 16   Ht 6' (1.829 m)   Wt 180 lb 4 oz (81.8 kg)   SpO2 97%   BMI 24.45 kg/m  General: Well developed, NAD, BMI noted Neck: No  thyromegaly  HEENT:  Normocephalic . Face symmetric, atraumatic Lungs:  CTA B Normal respiratory effort, no intercostal retractions, no accessory muscle use.  Heart: RRR,  no murmur.  Abdomen:  Not distended, soft, non-tender. No rebound or rigidity.   Lower extremities: no pretibial edema bilaterally  Skin: Exposed areas without rash. Not pale. Not jaundice Neurologic:  alert & oriented X3.  Speech normal, gait appropriate for age and unassisted Strength symmetric and appropriate for age.  Psych: Cognition and judgment appear intact.  Cooperative with normal attention span and concentration.  Behavior appropriate. No anxious or depressed appearing.     Assessment    Assessment HTN GERD Nephrolithiasis, S/P procedures, rx K citrate ~ 03-2016 Rosacea Shingles, L chest 2018   xanthelasma  PLAN: Here for CPX HTN: Good compliance with medications, ambulatory BPs normal, checking labs.  No change Nephrolithiasis: on K citrate, no symptoms. IBS: Saw GI with abdominal pain, clinical diagnosis with postviral IBS.  Rx dicyclomine.  Subsequently on 10/07/2021 had endoscopies: Colonoscopy: No polyps, normal. EGD: Erythema of the antrum and stomach, biopsy done.  Results not available to me Symptoms are largely resolved. RTC 1 year

## 2022-05-26 NOTE — Patient Instructions (Signed)
Check the  blood pressure regularly °BP GOAL is between 110/65 and  135/85. °If it is consistently higher or lower, let me know °  ° °GO TO THE LAB : Get the blood work   ° ° °GO TO THE FRONT DESK, PLEASE SCHEDULE YOUR APPOINTMENTS °Come back for   a physical exam in 1 year °

## 2022-06-21 ENCOUNTER — Other Ambulatory Visit: Payer: Self-pay | Admitting: Internal Medicine

## 2022-11-12 ENCOUNTER — Other Ambulatory Visit: Payer: Self-pay | Admitting: Internal Medicine

## 2023-06-01 ENCOUNTER — Encounter: Payer: BC Managed Care – PPO | Admitting: Internal Medicine

## 2023-06-18 ENCOUNTER — Telehealth: Payer: BC Managed Care – PPO | Admitting: Nurse Practitioner

## 2023-06-18 DIAGNOSIS — W540XXA Bitten by dog, initial encounter: Secondary | ICD-10-CM | POA: Diagnosis not present

## 2023-06-18 DIAGNOSIS — L089 Local infection of the skin and subcutaneous tissue, unspecified: Secondary | ICD-10-CM

## 2023-06-18 MED ORDER — AMOXICILLIN-POT CLAVULANATE 875-125 MG PO TABS: 1.0000 | ORAL_TABLET | Freq: Two times a day (BID) | ORAL | 0 refills | Status: DC

## 2023-06-18 NOTE — Progress Notes (Signed)
E-Visit for Simple Cut/Laceration  We are sorry that you have had an injury. Here is how we plan to help!  Based on what you shared with me it looks like you have a simple laceration that does not need to be repaired with stitches or tissue glue.  and I have prescribed Augmentin, 875 mg by mouth twice daily for seven days  Your last Tetanus shot on record was 2020 so you do not need a booster at this time    HOME CARE: Clean the cut or scrape - Wash it well with soap and water. * avoid using hydrogen peroxide which may cause tissue damage, or impede wound healing.  Stop the bleeding - If your cut or scrape is bleeding, press a clean cloth or bandage firmly on the area for 20 minutes. You can also help slow the bleeding by holding the cut above the level of your heart.   Put a thin layer of Bacitracin antibiotic ointment on the cut or scrape. (this can be purchased at any local pharmacy- ask your pharmacist if you need assistance)   It is best to keep a dog bite open to air as much as possible. You may use a band aid while there is bleeding, once that has stopped try to keep the wound open/ without occlusive dressing.   Watch for signs that your cut or scrape is infected (redness, drainage, pain, warmth, swelling or fever)  Over the next 48 hours your wound should start to improve with less pain, less swelling and less redness. If you should develop increasing pain, swelling, redness, fever, pus from the wound you should be seen immediately to make sure this is not becoming infected.   WOUND CARE: Please keep a layer of antibiotic ointment (bacitracin preferred) on this wound at least twice a day for the next seven days and keep a sterile dressing over top of it. You may gently clean the wound with warm soap and water between dressing changes.  We strongly recommend that you have a medical provider reevaluate your wound within 2 to 3 days in person to make sure that it is healing  appropriately.  Thank you for choosing an e-visit.  Your e-visit answers were reviewed by a board certified advanced clinical practitioner to complete your personal care plan. Depending upon the condition, your plan could have included both over the counter or prescription medications.  Please review your pharmacy choice. Make sure the pharmacy is open so you can pick up prescription now. If there is a problem, you may contact your provider through [employer] and have the prescription routed to another pharmacy.  Your safety is important to [place]. If you have drug allergies check your prescription carefully.   For the next 24 hours you can use MyChart to ask questions about today's visit, request a non-urgent call back, or ask for a work or school excuse. You will get an email in the next two days asking about your experience. I hope that your e-visit has been valuable and will speed your recovery.    Meds ordered this encounter  Medications   amoxicillin-clavulanate (AUGMENTIN) 875-125 MG tablet    Sig: Take 1 tablet by mouth 2 (two) times daily for 7 days. Take with food    Dispense:  14 tablet    Refill:  0     I spent approximately 5 minutes reviewing the patient's history, current symptoms and coordinating their care today.

## 2023-06-23 ENCOUNTER — Encounter: Payer: Self-pay | Admitting: Internal Medicine

## 2023-06-23 ENCOUNTER — Ambulatory Visit: Payer: BC Managed Care – PPO | Admitting: Internal Medicine

## 2023-06-23 VITALS — BP 126/80 | HR 66 | Temp 98.1°F | Resp 16 | Ht 72.0 in | Wt 181.1 lb

## 2023-06-23 DIAGNOSIS — I10 Essential (primary) hypertension: Secondary | ICD-10-CM

## 2023-06-23 DIAGNOSIS — Z0000 Encounter for general adult medical examination without abnormal findings: Secondary | ICD-10-CM

## 2023-06-23 LAB — LIPID PANEL
Cholesterol: 195 mg/dL (ref 0–200)
HDL: 41.7 mg/dL (ref >39.00)
LDL Cholesterol: 122 mg/dL — ABNORMAL HIGH (ref 0–99)
NonHDL: 152.82
Total CHOL/HDL Ratio: 5
Triglycerides: 155 mg/dL — ABNORMAL HIGH (ref 0.0–149.0)
VLDL: 31 mg/dL (ref 0.0–40.0)

## 2023-06-23 LAB — COMPREHENSIVE METABOLIC PANEL
ALT: 25 U/L (ref 0–53)
AST: 21 U/L (ref 0–37)
Albumin: 4.3 g/dL (ref 3.5–5.2)
Alkaline Phosphatase: 56 U/L (ref 39–117)
BUN: 12 mg/dL (ref 6–23)
CO2: 28 meq/L (ref 19–32)
Calcium: 9.6 mg/dL (ref 8.4–10.5)
Chloride: 102 meq/L (ref 96–112)
Creatinine, Ser: 0.93 mg/dL (ref 0.40–1.50)
GFR: 100.02 mL/min (ref >60.00)
Glucose, Bld: 82 mg/dL (ref 70–99)
Potassium: 4.2 meq/L (ref 3.5–5.1)
Sodium: 139 meq/L (ref 135–145)
Total Bilirubin: 0.7 mg/dL (ref 0.2–1.2)
Total Protein: 7.3 g/dL (ref 6.0–8.3)

## 2023-06-23 LAB — CBC WITH DIFFERENTIAL/PLATELET
Basophils Absolute: 0 10*3/uL (ref 0.0–0.1)
Basophils Relative: 0.4 % (ref 0.0–3.0)
Eosinophils Absolute: 0.3 10*3/uL (ref 0.0–0.7)
Eosinophils Relative: 4.2 % (ref 0.0–5.0)
HCT: 47 % (ref 39.0–52.0)
Hemoglobin: 15.7 g/dL (ref 13.0–17.0)
Lymphocytes Relative: 29.9 % (ref 12.0–46.0)
Lymphs Abs: 2.1 10*3/uL (ref 0.7–4.0)
MCHC: 33.5 g/dL (ref 30.0–36.0)
MCV: 90.3 fL (ref 78.0–100.0)
Monocytes Absolute: 0.6 10*3/uL (ref 0.1–1.0)
Monocytes Relative: 7.8 % (ref 3.0–12.0)
Neutro Abs: 4.1 10*3/uL (ref 1.4–7.7)
Neutrophils Relative %: 57.7 % (ref 43.0–77.0)
Platelets: 200 10*3/uL (ref 150.0–400.0)
RBC: 5.2 Mil/uL (ref 4.22–5.81)
RDW: 13.4 % (ref 11.5–15.5)
WBC: 7.2 10*3/uL (ref 4.0–10.5)

## 2023-06-23 NOTE — Patient Instructions (Addendum)
Vaccines I recommend: Covid booster     GO TO THE LAB : Get the blood work     Next visit with me in 1 year for physical exam    Please schedule it at the front desk

## 2023-06-23 NOTE — Progress Notes (Unsigned)
Subjective:    Patient ID: Willie Jo., male    DOB: October 08, 1980, 42 y.o.   MRN: [id number]  DOS:  06/23/2023 Type of visit - description: cpx   Here for CPX. Doing well has no major concerns.   Review of Systems See above   Past Medical History:  Diagnosis Date   GERD (gastroesophageal reflux disease)    History of kidney stones    HTN (hypertension) 03/13/2013   Nephrolithiasis    right non-obstructive per ct 12-30-2015   Right ureteral stone    Rosacea    symptoms increase w/ beer which he avoicds, dx @ derm   Wears contact lenses     Past Surgical History:  Procedure Laterality Date   CYSTOSCOPY WITH RETROGRADE PYELOGRAM, URETEROSCOPY AND STENT PLACEMENT Left 02/27/2013   Procedure: CYSTOSCOPY WITH RETROGRADE PYELOGRAM, URETEROSCOPY AND STENT PLACEMENT;  Surgeon: Sebastian Ache, MD;  Location: [facility];  Service: Urology;  Laterality: Left;   NEED DIGITAL URETERSCOPE    CYSTOSCOPY WITH RETROGRADE PYELOGRAM, URETEROSCOPY AND STENT PLACEMENT Right 01/01/2016   Procedure: CYSTOSCOPY WITH RIGHT RETROGRADE PYELOGRAM, URETEROSCOPY AND STENT PLACEMENT;  Surgeon: Sebastian Ache, MD;  Location: [facility];  Service: Urology;  Laterality: Right;   HOLMIUM LASER APPLICATION Left 02/27/2013   Procedure: HOLMIUM LASER APPLICATION;  Surgeon: Sebastian Ache, MD;  Location: [facility];  Service: Urology;  Laterality: Left;   HOLMIUM LASER APPLICATION Right 01/01/2016   Procedure: HOLMIUM LASER APPLICATION;  Surgeon: Sebastian Ache, MD;  Location: [facility];  Service: Urology;  Laterality: Right;   LITHOTRIPSY  01-30-2013    Current Outpatient Medications  Medication Instructions   amoxicillin-clavulanate (AUGMENTIN) 875-125 MG tablet 1 tablet, Oral, 2 times daily, Take with food   irbesartan (AVAPRO) 300 mg, Oral, Daily   Potassium Citrate 15 MEQ (1620 MG) TBCR 1 tablet, Oral, 2 times daily       Objective:   Physical Exam BP 126/80   Pulse 66    Temp 98.1 F (36.7 C) (Oral)   Resp 16   Ht 6' (1.829 m)   Wt 181 lb 2 oz (82.2 kg)   SpO2 97%   BMI 24.56 kg/m  General: Well developed, NAD, BMI noted Neck: No  thyromegaly  HEENT:  Normocephalic . Face symmetric, atraumatic Lungs:  CTA B Normal respiratory effort, no intercostal retractions, no accessory muscle use. Heart: RRR,  no murmur.  Abdomen:  Not distended, soft, non-tender. No rebound or rigidity.   Lower extremities: no pretibial edema bilaterally  Skin: Exposed areas without rash. Not pale. Not jaundice Neurologic:  alert & oriented X3.  Speech normal, gait appropriate for age and unassisted Strength symmetric and appropriate for age.  Psych: Cognition and judgment appear intact.  Cooperative with normal attention span and concentration.  Behavior appropriate. No anxious or depressed appearing.     Assessment     Assessment HTN GERD, IBS?  (GI workup 2023) Nephrolithiasis, S/P procedures, rx K citrate ~ 03-2016 Rosacea Shingles, L chest 2018 Xanthelasma Peyronie's disease dx @ urology ~ 2024  PLAN: Here for CPX  - Tdap 04/2019 - had a flu shot  -  C-19 vax: recommended  -CCS: Had a negative colonoscopy 09-2021 for GI symptoms that are largely now resolved -Prostate cancer screening: Started at the age 89 -Labs: CMP, FLP, CBC  -Diet and exercise: Room for improvement, encouraged heart healthy diet, fruits, vegetables, portion control.  He remains active. HTN: BP at today's excellent: Continue afebrile, labs.  Peyronie's disease: Recently diagnosed by urology GERD: Not an issue at this point RTC 1 year.   HTN: Good compliance with medications, ambulatory BPs normal, checking labs.  No change Nephrolithiasis: on K citrate, no symptoms. IBS: Saw GI with abdominal pain, clinical diagnosis with postviral IBS.  Rx dicyclomine.  Subsequently on 10/07/2021 had endoscopies: Colonoscopy: No polyps, normal. EGD: Erythema of the antrum and stomach, biopsy  done.  Results not available to me Symptoms are largely resolved. RTC 1 year

## 2023-06-24 ENCOUNTER — Encounter: Payer: Self-pay | Admitting: Internal Medicine

## 2023-06-24 NOTE — Assessment & Plan Note (Signed)
Here for CPX - Tdap 04/2019 - had a flu shot  -  C-19 vax: recommended  -CCS: Had a negative colonoscopy 09-2021 for GI symptoms that are largely now resolved -Prostate cancer screening: Start at the age 42 -Labs: CMP, FLP, CBC  -Diet and exercise: Room for improvement, encouraged heart healthy diet, fruits, vegetables, portion control.  He remains active.

## 2023-06-24 NOTE — Assessment & Plan Note (Signed)
Here for CPX HTN: BP at today is excellent: Continue avapro, labs. Peyronie's disease: Recently diagnosed by urology per pt  GERD: Not an issue at this point RTC 1 year.

## 2023-07-18 ENCOUNTER — Encounter: Payer: Self-pay | Admitting: Internal Medicine

## 2023-07-19 MED ORDER — IRBESARTAN 300 MG PO TABS: 300.0000 mg | ORAL_TABLET | Freq: Every day | ORAL | 3 refills | Status: DC

## 2023-10-10 ENCOUNTER — Telehealth: Admitting: Family Medicine

## 2023-10-10 DIAGNOSIS — J019 Acute sinusitis, unspecified: Secondary | ICD-10-CM

## 2023-10-10 DIAGNOSIS — B9689 Other specified bacterial agents as the cause of diseases classified elsewhere: Secondary | ICD-10-CM

## 2023-10-10 MED ORDER — AMOXICILLIN-POT CLAVULANATE 875-125 MG PO TABS: 1.0000 | ORAL_TABLET | Freq: Two times a day (BID) | ORAL | 0 refills | Status: AC

## 2023-10-10 NOTE — Progress Notes (Signed)

## 2024-03-13 ENCOUNTER — Telehealth: Admitting: Physician Assistant

## 2024-03-13 DIAGNOSIS — M549 Dorsalgia, unspecified: Secondary | ICD-10-CM | POA: Diagnosis not present

## 2024-03-13 MED ORDER — NAPROXEN 500 MG PO TABS
500.0000 mg | ORAL_TABLET | Freq: Two times a day (BID) | ORAL | 0 refills | Status: AC
Start: 2024-03-13 — End: ?

## 2024-03-13 MED ORDER — CYCLOBENZAPRINE HCL 10 MG PO TABS: 10.0000 mg | ORAL_TABLET | Freq: Three times a day (TID) | ORAL | 0 refills | Status: AC | PRN

## 2024-03-13 NOTE — Progress Notes (Signed)
 I have spent 5 minutes in review of e-visit questionnaire, review and updating patient chart, medical decision making and response to patient.   Laure Kidney, PA-C

## 2024-03-13 NOTE — Progress Notes (Signed)

## 2024-05-29 ENCOUNTER — Telehealth: Admitting: Family Medicine

## 2024-05-29 DIAGNOSIS — H6991 Unspecified Eustachian tube disorder, right ear: Secondary | ICD-10-CM

## 2024-05-29 DIAGNOSIS — H9201 Otalgia, right ear: Secondary | ICD-10-CM

## 2024-05-29 MED ORDER — AMOXICILLIN 875 MG PO TABS: 875.0000 mg | ORAL_TABLET | Freq: Two times a day (BID) | ORAL | 0 refills | Status: AC

## 2024-05-29 MED ORDER — PREDNISONE 20 MG PO TABS: 20.0000 mg | ORAL_TABLET | Freq: Two times a day (BID) | ORAL | 0 refills | Status: AC

## 2024-05-29 NOTE — Progress Notes (Signed)
 E-Visit for Ear Pain - Acute Otitis Media   We are sorry that you are not feeling well. Here is how we plan to help!  Based on what you have shared with me it looks like you have Acute Otitis Media.  Acute Otitis Media is an infection of the middle or inner ear. This type of infection can cause redness, inflammation, and fluid buildup behind the tympanic membrane (ear drum).  The usual symptoms include: Earache/Pain Fever Upper respiratory symptoms Lack of energy/Fatigue/Malaise Slight hearing loss gradually worsening- if the inner ear fills with fluid What causes middle ear infections? Most middle ear infections occur when an infection such as a cold, leads to a build-up of mucus in the middle ear and causes the Eustachian tube (a thin tube that runs from the middle ear to the back of the nose) to become swollen or blocked.   This means mucus can't drain away properly, making it easier for an infection to spread into the middle ear.  How middle ear infections are treated: Most ear infections clear up within three to five days and don't need any specific treatment. If necessary, tylenol  or ibuprofen should be used to relieve pain and a high temperature.  If you develop a fever higher than 102, or any significantly worsening symptoms, this could indicate a more serious infection moving to the middle/inner and needs face to face evaluation in an office by a provider.   Antibiotics aren't routinely used to treat middle ear infections, although they may occasionally be prescribed if symptoms persist or are particularly severe. Given your presentation,   I have prescribed Amoxicillin  875 mg one tablet twice daily for 10 days  I have also sent prednisone  for 5 days    Your symptoms should improve over the next 3 days and should resolve in about 7 days. Be sure to complete ALL of the prescription(s) given.  HOME CARE: Wash your hands frequently. If you are prescribed an ear drop, do not  place the tip of the bottle on your ear or touch it with your fingers. You can take Acetaminophen  650 mg every 4-6 hours as needed for pain.  If pain is severe or moderate, you can apply a heating pad (set on low) or hot water bottle (wrapped in a towel) to outer ear for 20 minutes.  This will also increase drainage.  GET HELP RIGHT AWAY IF: Fever is over 102.2 degrees. You develop progressive ear pain or hearing loss. Ear symptoms persist longer than 3 days after treatment.  MAKE SURE YOU: Understand these instructions. Will watch your condition. Will get help right away if you are not doing well or get worse.  Thank you for choosing an e-visit.  Your e-visit answers were reviewed by a board certified advanced clinical practitioner to complete your personal care plan. Depending upon the condition, your plan could have included both over the counter or prescription medications.  Please review your pharmacy choice. Make sure the pharmacy is open so you can pick up the prescription now. If there is a problem, you may contact your provider through [employer] and have the prescription routed to another pharmacy.  Your safety is important to us . If you have drug allergies check your prescription carefully.   For the next 24 hours you can use MyChart to ask questions about today's visit, request a non-urgent call back, or ask for a work or school excuse. You will get an email with a survey after your eVisit asking  about your experience. We would appreciate your feedback. I hope that your e-visit has been valuable and will aid in your recovery.  I have spent 5 minutes in review of e-visit questionnaire, review and updating patient chart, medical decision making and response to patient.   Annie Roseboom, FNP

## 2024-06-26 ENCOUNTER — Encounter: Payer: BC Managed Care – PPO | Admitting: Internal Medicine

## 2024-06-27 ENCOUNTER — Encounter: Admitting: Internal Medicine

## 2024-07-16 ENCOUNTER — Other Ambulatory Visit: Payer: Self-pay | Admitting: Internal Medicine

## 2024-09-06 ENCOUNTER — Encounter: Admitting: Internal Medicine

## 2024-11-22 ENCOUNTER — Encounter: Admitting: Internal Medicine
